# Patient Record
Sex: Male | Born: 1990 | Race: White | Hispanic: No | Marital: Single | Smoking: Never smoker
Health system: Southern US, Community
[De-identification: ages and names within clinical notes are randomized; demographics above are authoritative.]

## PROBLEM LIST (undated history)

## (undated) DIAGNOSIS — D649 Anemia, unspecified: Secondary | ICD-10-CM

## (undated) DIAGNOSIS — R51 Headache: Secondary | ICD-10-CM

## (undated) DIAGNOSIS — I10 Essential (primary) hypertension: Secondary | ICD-10-CM

## (undated) DIAGNOSIS — Q639 Congenital malformation of kidney, unspecified: Secondary | ICD-10-CM

## (undated) DIAGNOSIS — R519 Headache, unspecified: Secondary | ICD-10-CM

## (undated) HISTORY — PX: EYE SURGERY: SHX253

## (undated) HISTORY — PX: KIDNEY TRANSPLANT: SHX239

---

## 2015-01-22 ENCOUNTER — Encounter (HOSPITAL_COMMUNITY): Payer: Self-pay | Admitting: Emergency Medicine

## 2015-01-22 ENCOUNTER — Emergency Department (INDEPENDENT_AMBULATORY_CARE_PROVIDER_SITE_OTHER)
Admission: EM | Admit: 2015-01-22 | Discharge: 2015-01-22 | Disposition: A | Discharge status: Nursing Home (Includes ICF) | Payer: 59 | Source: Home / Self Care | Attending: Emergency Medicine | Admitting: Emergency Medicine

## 2015-01-22 ENCOUNTER — Emergency Department (HOSPITAL_COMMUNITY): Payer: 59

## 2015-01-22 ENCOUNTER — Inpatient Hospital Stay (HOSPITAL_COMMUNITY)
Admission: EM | Admit: 2015-01-22 | Discharge: 2015-01-27 | DRG: 872 | Disposition: A | Discharge status: Home Health | Payer: 59 | Attending: Internal Medicine | Admitting: Internal Medicine

## 2015-01-22 DIAGNOSIS — Z7982 Long term (current) use of aspirin: Secondary | ICD-10-CM

## 2015-01-22 DIAGNOSIS — Z792 Long term (current) use of antibiotics: Secondary | ICD-10-CM

## 2015-01-22 DIAGNOSIS — A4101 Sepsis due to Methicillin susceptible Staphylococcus aureus: Principal | ICD-10-CM | POA: Diagnosis present

## 2015-01-22 DIAGNOSIS — N10 Acute tubulo-interstitial nephritis: Secondary | ICD-10-CM | POA: Diagnosis present

## 2015-01-22 DIAGNOSIS — I10 Essential (primary) hypertension: Secondary | ICD-10-CM | POA: Diagnosis present

## 2015-01-22 DIAGNOSIS — Z886 Allergy status to analgesic agent status: Secondary | ICD-10-CM

## 2015-01-22 DIAGNOSIS — A419 Sepsis, unspecified organism: Secondary | ICD-10-CM | POA: Diagnosis present

## 2015-01-22 DIAGNOSIS — Z87891 Personal history of nicotine dependence: Secondary | ICD-10-CM

## 2015-01-22 DIAGNOSIS — N329 Bladder disorder, unspecified: Secondary | ICD-10-CM | POA: Diagnosis present

## 2015-01-22 DIAGNOSIS — N3289 Other specified disorders of bladder: Secondary | ICD-10-CM

## 2015-01-22 DIAGNOSIS — Z7952 Long term (current) use of systemic steroids: Secondary | ICD-10-CM

## 2015-01-22 DIAGNOSIS — N390 Urinary tract infection, site not specified: Secondary | ICD-10-CM

## 2015-01-22 DIAGNOSIS — R300 Dysuria: Secondary | ICD-10-CM | POA: Diagnosis not present

## 2015-01-22 DIAGNOSIS — R509 Fever, unspecified: Secondary | ICD-10-CM

## 2015-01-22 DIAGNOSIS — Z940 Kidney transplant status: Secondary | ICD-10-CM

## 2015-01-22 DIAGNOSIS — Z91048 Other nonmedicinal substance allergy status: Secondary | ICD-10-CM

## 2015-01-22 DIAGNOSIS — R7881 Bacteremia: Secondary | ICD-10-CM | POA: Insufficient documentation

## 2015-01-22 DIAGNOSIS — Z79899 Other long term (current) drug therapy: Secondary | ICD-10-CM

## 2015-01-22 DIAGNOSIS — T8613 Kidney transplant infection: Secondary | ICD-10-CM

## 2015-01-22 DIAGNOSIS — Z880 Allergy status to penicillin: Secondary | ICD-10-CM

## 2015-01-22 DIAGNOSIS — B9561 Methicillin susceptible Staphylococcus aureus infection as the cause of diseases classified elsewhere: Secondary | ICD-10-CM | POA: Diagnosis present

## 2015-01-22 HISTORY — DX: Congenital malformation of kidney, unspecified: Q63.9

## 2015-01-22 HISTORY — DX: Anemia, unspecified: D64.9

## 2015-01-22 HISTORY — DX: Essential (primary) hypertension: I10

## 2015-01-22 HISTORY — DX: Headache: R51

## 2015-01-22 HISTORY — DX: Headache, unspecified: R51.9

## 2015-01-22 LAB — COMPREHENSIVE METABOLIC PANEL
ALBUMIN: 4.1 g/dL (ref 3.5–5.0)
ALT: 67 U/L — AB (ref 17–63)
AST: 66 U/L — AB (ref 15–41)
Alkaline Phosphatase: 75 U/L (ref 38–126)
Anion gap: 9 (ref 5–15)
BUN: 10 mg/dL (ref 6–20)
CHLORIDE: 101 mmol/L (ref 101–111)
CO2: 25 mmol/L (ref 22–32)
CREATININE: 0.98 mg/dL (ref 0.61–1.24)
Calcium: 9.7 mg/dL (ref 8.9–10.3)
GFR calc non Af Amer: >60 mL/min (ref >60)
GLUCOSE: 113 mg/dL — AB (ref 65–99)
Potassium: 3.6 mmol/L (ref 3.5–5.1)
SODIUM: 135 mmol/L (ref 135–145)
Total Bilirubin: 1 mg/dL (ref 0.3–1.2)
Total Protein: 7.5 g/dL (ref 6.5–8.1)

## 2015-01-22 LAB — URINALYSIS, ROUTINE W REFLEX MICROSCOPIC
Bilirubin Urine: NEGATIVE
GLUCOSE, UA: NEGATIVE mg/dL
Ketones, ur: 15 mg/dL — AB
Nitrite: NEGATIVE
PROTEIN: NEGATIVE mg/dL
Specific Gravity, Urine: 1.009 (ref 1.005–1.030)
pH: 6.5 (ref 5.0–8.0)

## 2015-01-22 LAB — CBC
HCT: 46.4 % (ref 39.0–52.0)
Hemoglobin: 15.5 g/dL (ref 13.0–17.0)
MCH: 28.8 pg (ref 26.0–34.0)
MCHC: 33.4 g/dL (ref 30.0–36.0)
MCV: 86.1 fL (ref 78.0–100.0)
PLATELETS: 199 10*3/uL (ref 150–400)
RBC: 5.39 MIL/uL (ref 4.22–5.81)
RDW: 13 % (ref 11.5–15.5)
WBC: 17 10*3/uL — ABNORMAL HIGH (ref 4.0–10.5)

## 2015-01-22 LAB — POCT URINALYSIS DIP (DEVICE)
BILIRUBIN URINE: NEGATIVE
GLUCOSE, UA: NEGATIVE mg/dL
KETONES UR: NEGATIVE mg/dL
Nitrite: NEGATIVE
Protein, ur: NEGATIVE mg/dL
Specific Gravity, Urine: 1.01 (ref 1.005–1.030)
Urobilinogen, UA: 0.2 mg/dL (ref 0.0–1.0)
pH: 7 (ref 5.0–8.0)

## 2015-01-22 LAB — I-STAT CG4 LACTIC ACID, ED
Lactic Acid, Venous: 1.06 mmol/L (ref 0.5–2.0)
Lactic Acid, Venous: 0.81 mmol/L (ref 0.5–2.0)

## 2015-01-22 LAB — I-STAT CHEM 8, ED
BUN: 14 mg/dL (ref 6–20)
CHLORIDE: 98 mmol/L — AB (ref 101–111)
CREATININE: 0.9 mg/dL (ref 0.61–1.24)
Calcium, Ion: 1.21 mmol/L (ref 1.12–1.23)
GLUCOSE: 110 mg/dL — AB (ref 65–99)
HCT: 51 % (ref 39.0–52.0)
Hemoglobin: 17.3 g/dL — ABNORMAL HIGH (ref 13.0–17.0)
POTASSIUM: 3.6 mmol/L (ref 3.5–5.1)
Sodium: 137 mmol/L (ref 135–145)
TCO2: 24 mmol/L (ref 0–100)

## 2015-01-22 LAB — URINE MICROSCOPIC-ADD ON

## 2015-01-22 LAB — LIPASE, BLOOD: LIPASE: 23 U/L (ref 11–51)

## 2015-01-22 LAB — I-STAT TROPONIN, ED: Troponin i, poc: 0.01 ng/mL (ref 0.00–0.08)

## 2015-01-22 MED ORDER — BARIUM SULFATE 2.1 % PO SUSP
ORAL | Status: AC
  Filled 2015-01-22: qty 2

## 2015-01-22 MED ORDER — SODIUM CHLORIDE 0.9 % IV BOLUS (SEPSIS)
1000.0000 mL | Freq: Once | INTRAVENOUS | Status: AC
  Administered 2015-01-22: 1000 mL via INTRAVENOUS

## 2015-01-22 NOTE — ED Notes (Signed)
The [facility] with a complaint of a possible UTI with a fever. The patient stated that he has dysuria. He also stated that he had bilateral kidney transplants in 2006 and he contacted his nephrologist that [facility].

## 2015-01-22 NOTE — ED Notes (Addendum)
Chem-8 and I-stat trop result are not this pts results RN aware

## 2015-01-22 NOTE — ED Notes (Signed)
Phlebotomy at bedside at this time.

## 2015-01-22 NOTE — ED Provider Notes (Signed)
CSN: [id number]     Arrival date & time 01/22/15  2023 History   First MD Initiated Contact with Patient 01/22/15 2107     Chief Complaint  Patient presents with  . Fever  . Flank Pain     (Consider location/radiation/quality/duration/timing/severity/associated sxs/prior Treatment) HPI   Larry Mcgee is a 24 y.o. male with PMH significant for congenital renal anomaly with kidney transplant who presents from UC for further evaluation of dysuria.  Patient reports gradual onset, moderate, dysuria that began approximately 4 PM.  No meds PTA.  No aggravating factors.  Associated symptoms include abdominal pain, fever, nausea, and cough.  Denies vomiting, diarrhea, hematuria, CP, or SOB.  [place]oved [place] in [place] and does not have a PCP.   Past Medical History  Diagnosis Date  . Congenital renal anomaly    Past Surgical History  Procedure Laterality Date  . Kidney transplant     No family history on file. Social History  Substance Use Topics  . Smoking status: Current Some Day Smoker  . Smokeless tobacco: None  . Alcohol Use: Yes    Review of Systems  All other systems negative unless otherwise stated in HPI   Allergies  Ampicillin; Codeine; and Latex  Home Medications   Prior to Admission medications   Medication Sig Start Date End Date Taking? Authorizing Provider  acyclovir (ZOVIRAX) 200 MG capsule Take 200 mg by mouth 2 (two) times daily.   Yes Historical Provider, MD  aspirin 81 MG tablet Take 81 mg by mouth daily.   Yes Historical Provider, MD  lisinopril (PRINIVIL,ZESTRIL) 5 MG tablet Take 5 mg by mouth daily.   Yes Historical Provider, MD  Multiple Vitamin (MULTIVITAMIN WITH MINERALS) TABS tablet Take 1 tablet by mouth daily.   Yes Historical Provider, MD  mycophenolate (CELLCEPT) 500 MG tablet Take 750 mg by mouth 2 (two) times daily.   Yes Historical Provider, MD  predniSONE (DELTASONE) 5 MG tablet Take 5 mg by mouth daily with breakfast.   Yes  Historical Provider, MD  tacrolimus (PROGRAF) 1 MG capsule Take 4 mg by mouth 2 (two) times daily.   Yes Historical Provider, MD  nitrofurantoin (MACRODANTIN) 50 MG capsule Take 50 mg by mouth at bedtime.    Historical Provider, MD   BP 124/65 mmHg  Pulse 104  Temp(Src) 100.6 F (38.1 C) (Oral)  Resp 16  SpO2 100% Physical Exam  Constitutional: He is oriented to person, place, and time. He appears well-developed and well-nourished.  HENT:  Head: Normocephalic and atraumatic.  Mouth/Throat: Oropharynx is clear and moist.  Eyes: Conjunctivae are normal. Pupils are equal, round, and reactive to light.  Neck: Normal range of motion. Neck supple.  Cardiovascular: Regular rhythm and normal heart sounds.  Tachycardia present.   No murmur heard. Pulmonary/Chest: Effort normal and breath sounds normal. No accessory muscle usage or stridor. No respiratory distress. He has no wheezes. He has no rhonchi. He has no rales.  Abdominal: Soft. Bowel sounds are normal. He exhibits no distension. There is tenderness in the right upper quadrant, right lower quadrant, periumbilical area and suprapubic area. There is no rebound and no guarding.  Musculoskeletal: Normal range of motion.  Lymphadenopathy:    He has no cervical adenopathy.  Neurological: He is alert and oriented to person, place, and time.  Speech clear without dysarthria.  Skin: Skin is warm and dry.  Psychiatric: He has a normal mood and affect. His behavior is normal.    ED Course  Procedures (including critical care time) Labs Review Labs Reviewed  COMPREHENSIVE METABOLIC PANEL - Abnormal; Notable for the following:    Glucose, Bld 113 (*)    AST 66 (*)    ALT 67 (*)    All other components within normal limits  CBC - Abnormal; Notable for the following:    WBC 17.0 (*)    All other components within normal limits  URINALYSIS, ROUTINE W REFLEX MICROSCOPIC (NOT AT [facility]) - Abnormal; Notable for the following:    Hgb urine  dipstick SMALL (*)    Ketones, ur 15 (*)    Leukocytes, UA MODERATE (*)    All other components within normal limits  URINE MICROSCOPIC-ADD ON - Abnormal; Notable for the following:    Squamous Epithelial / LPF 0-5 (*)    Bacteria, UA FEW (*)    All other components within normal limits  I-STAT CHEM 8, ED - Abnormal; Notable for the following:    Chloride 98 (*)    Glucose, Bld 110 (*)    Hemoglobin 17.3 (*)    All other components within normal limits  CULTURE, BLOOD (ROUTINE X 2)  CULTURE, BLOOD (ROUTINE X 2)  LIPASE, BLOOD  I-STAT CG4 LACTIC ACID, ED  I-STAT TROPOININ, ED  I-STAT CG4 LACTIC ACID, ED    Imaging Review Dg Chest Port 1 View  01/22/2015  CLINICAL DATA:  Right lower quadrant pain, cough, fever. Renal transplant. EXAM: PORTABLE CHEST 1 VIEW COMPARISON:  None. FINDINGS: A single AP portable view of the chest demonstrates no focal airspace consolidation or alveolar edema. The lungs are grossly clear. There is no large effusion or pneumothorax. Cardiac and mediastinal contours appear unremarkable. IMPRESSION: No active disease. Electronically Signed   By: Ellery Plunk M.D.   On: 01/22/2015 21:40   I have personally reviewed and evaluated these images and lab results as part of my medical decision-making.   EKG Interpretation None      MDM   Final diagnoses:  UTI (lower urinary tract infection)    Patient presents with lower abdominal pain and dysuria that started this afternoon.  Seen at [facility] and sent here for further evaluation.  Patient is febrile 100.6, tachycardic at 107, normal respirations, no hypoxia, and normotensive.  On exam, abdomen is soft and tender in RLQ, RUQ, periumbilical area, and suprapubic area.  Will obtain labs, start fluids, and will obtain CT abdomen pelvis.  Patient refused CT abdomen and pelvis.  Will likely admit given transplant history and immunocompromised state for sepsis r/o.  -Lactic acid 1.06 -CBC shows leukocytosis of 17.0,  hgb 15.5 -CMP unremarkable.  Cr 0.98. -UA shows small hgb and moderate leukocytes.  Will give IV levaquin and aztreonam   Patient admitted to hospitalist, Dr. Toniann Fail.  Case has been discussed with and seen by Dr. Denton Lank who agrees with the above plan for admission.   Cheri Fowler, PA-C 01/23/15 0007  Cathren Laine, MD 01/24/15 [phone]

## 2015-01-22 NOTE — ED Notes (Addendum)
Pt states hes having pain on his RLQ, fever, cough. Pt states hes had hx of kidney transplants and its hurting him there. Pt is AAOx4, in NAD. Pt has extensive abdominal surgeries, self caths 5 times a day, hx of bilateral kidney transplants

## 2015-01-22 NOTE — ED Provider Notes (Signed)
CSN: [id number]     Arrival date & time 01/22/15  1920 History   First MD Initiated Contact with Patient 01/22/15 1952     Chief Complaint  Patient presents with  . Fever  . Dysuria  . Urinary Tract Infection   (Consider location/radiation/quality/duration/timing/severity/associated sxs/prior Treatment) HPI  He is a 24 year old man here for evaluation of fever and kidney pain. He states he was born with abnormal kidneys and received a kidney transplant in 2006. Today around 4:30 he developed slight dysuria, pain in his kidney, and fever. He reports some nausea, but no vomiting. He has [place] [facility] [place] [place]. He called his nephrologist who sent him to the urgent care.  Past Medical History  Diagnosis Date  . Congenital renal anomaly    Past Surgical History  Procedure Laterality Date  . Kidney transplant     No family history on file. Social History  Substance Use Topics  . Smoking status: Not on file  . Smokeless tobacco: Not on file  . Alcohol Use: Not on file    Review of Systems As in history of present illness Allergies  Ampicillin and Codeine  Home Medications   Prior to Admission medications   Medication Sig Start Date End Date Taking? Authorizing Provider  acyclovir (ZOVIRAX) 200 MG capsule Take 200 mg by mouth 2 (two) times daily.   Yes Historical Provider, MD  aspirin 81 MG tablet Take 81 mg by mouth daily.   Yes Historical Provider, MD  lisinopril (PRINIVIL,ZESTRIL) 5 MG tablet Take 5 mg by mouth daily.   Yes Historical Provider, MD  mycophenolate (CELLCEPT) 500 MG tablet Take 750 mg by mouth 2 (two) times daily.   Yes Historical Provider, MD  prednisoLONE 5 MG TABS tablet Take by mouth.   Yes Historical Provider, MD  tacrolimus (PROGRAF) 1 MG capsule Take 4 mg by mouth 2 (two) times daily.   Yes Historical Provider, MD   Meds Ordered and Administered this Visit  Medications - No data to display  BP 127/80 mmHg  Pulse 121  Temp(Src) 102.4 F  (39.1 C) (Oral)  Resp 18  SpO2 97% No data found.   Physical Exam  Constitutional: He appears well-developed and well-nourished. No distress.  Cardiovascular: Regular rhythm.   Tachycardic  Pulmonary/Chest: Effort normal.  Abdominal: Soft.  He has a well-healed scar in the right lower quadrant. This is where his kidney is located. He is tender in this area. He also has a suprapubic ostomy.    ED Course  Procedures (including critical care time)  Labs Review Labs Reviewed  POCT URINALYSIS DIP (DEVICE) - Abnormal; Notable for the following:    Hgb urine dipstick SMALL (*)    Leukocytes, UA SMALL (*)    All other components within normal limits  URINE CULTURE    Imaging Review No results found.    MDM   1. Fever, unspecified fever cause   2. History of renal transplant   3. Dysuria    UA is concerning for infection with hemoglobin and white cells. More concerning is the pain in his kidney. As I am unable to do the appropriate blood work or imaging, will transfer to [facility] emergency room for workup and management. He is stable for transfer via shuttle.    Charm RingsErin J Aaleah Hirsch, MD 01/22/15 2026

## 2015-01-23 ENCOUNTER — Encounter (HOSPITAL_COMMUNITY): Payer: Self-pay | Admitting: *Deleted

## 2015-01-23 DIAGNOSIS — I10 Essential (primary) hypertension: Secondary | ICD-10-CM | POA: Diagnosis present

## 2015-01-23 DIAGNOSIS — N10 Acute tubulo-interstitial nephritis: Secondary | ICD-10-CM | POA: Diagnosis present

## 2015-01-23 DIAGNOSIS — Z87891 Personal history of nicotine dependence: Secondary | ICD-10-CM | POA: Diagnosis not present

## 2015-01-23 DIAGNOSIS — B9561 Methicillin susceptible Staphylococcus aureus infection as the cause of diseases classified elsewhere: Secondary | ICD-10-CM | POA: Diagnosis present

## 2015-01-23 DIAGNOSIS — N390 Urinary tract infection, site not specified: Secondary | ICD-10-CM | POA: Diagnosis not present

## 2015-01-23 DIAGNOSIS — A419 Sepsis, unspecified organism: Secondary | ICD-10-CM

## 2015-01-23 DIAGNOSIS — A4101 Sepsis due to Methicillin susceptible Staphylococcus aureus: Secondary | ICD-10-CM | POA: Diagnosis present

## 2015-01-23 DIAGNOSIS — Z886 Allergy status to analgesic agent status: Secondary | ICD-10-CM | POA: Diagnosis not present

## 2015-01-23 DIAGNOSIS — Z880 Allergy status to penicillin: Secondary | ICD-10-CM | POA: Diagnosis not present

## 2015-01-23 DIAGNOSIS — Z940 Kidney transplant status: Secondary | ICD-10-CM

## 2015-01-23 DIAGNOSIS — N329 Bladder disorder, unspecified: Secondary | ICD-10-CM | POA: Diagnosis present

## 2015-01-23 DIAGNOSIS — Z91048 Other nonmedicinal substance allergy status: Secondary | ICD-10-CM | POA: Diagnosis not present

## 2015-01-23 DIAGNOSIS — Z792 Long term (current) use of antibiotics: Secondary | ICD-10-CM | POA: Diagnosis not present

## 2015-01-23 DIAGNOSIS — Z7982 Long term (current) use of aspirin: Secondary | ICD-10-CM | POA: Diagnosis not present

## 2015-01-23 DIAGNOSIS — R509 Fever, unspecified: Secondary | ICD-10-CM | POA: Diagnosis present

## 2015-01-23 DIAGNOSIS — Z7952 Long term (current) use of systemic steroids: Secondary | ICD-10-CM | POA: Diagnosis not present

## 2015-01-23 DIAGNOSIS — R7881 Bacteremia: Secondary | ICD-10-CM | POA: Diagnosis not present

## 2015-01-23 DIAGNOSIS — Z79899 Other long term (current) drug therapy: Secondary | ICD-10-CM | POA: Diagnosis not present

## 2015-01-23 LAB — PROCALCITONIN: Procalcitonin: <0.1 ng/mL

## 2015-01-23 LAB — COMPREHENSIVE METABOLIC PANEL
ALK PHOS: 58 U/L (ref 38–126)
ALT: 47 U/L (ref 17–63)
AST: 45 U/L — AB (ref 15–41)
Albumin: 3.1 g/dL — ABNORMAL LOW (ref 3.5–5.0)
Anion gap: 6 (ref 5–15)
BUN: 9 mg/dL (ref 6–20)
CHLORIDE: 105 mmol/L (ref 101–111)
CO2: 26 mmol/L (ref 22–32)
CREATININE: 1.13 mg/dL (ref 0.61–1.24)
Calcium: 8.5 mg/dL — ABNORMAL LOW (ref 8.9–10.3)
GFR calc Af Amer: >60 mL/min (ref >60)
Glucose, Bld: 124 mg/dL — ABNORMAL HIGH (ref 65–99)
Potassium: 3.5 mmol/L (ref 3.5–5.1)
Sodium: 137 mmol/L (ref 135–145)
Total Bilirubin: 1 mg/dL (ref 0.3–1.2)
Total Protein: 5.9 g/dL — ABNORMAL LOW (ref 6.5–8.1)

## 2015-01-23 LAB — CBC WITH DIFFERENTIAL/PLATELET
Basophils Absolute: 0 10*3/uL (ref 0.0–0.1)
Basophils Relative: 0 %
Eosinophils Absolute: 0 10*3/uL (ref 0.0–0.7)
Eosinophils Relative: 0 %
HCT: 41.8 % (ref 39.0–52.0)
HEMOGLOBIN: 14.1 g/dL (ref 13.0–17.0)
LYMPHS ABS: 1.2 10*3/uL (ref 0.7–4.0)
LYMPHS PCT: 8 %
MCH: 29.3 pg (ref 26.0–34.0)
MCHC: 33.7 g/dL (ref 30.0–36.0)
MCV: 86.9 fL (ref 78.0–100.0)
Monocytes Absolute: 2.4 10*3/uL — ABNORMAL HIGH (ref 0.1–1.0)
Monocytes Relative: 16 %
NEUTROS PCT: 76 %
Neutro Abs: 11.3 10*3/uL — ABNORMAL HIGH (ref 1.7–7.7)
Platelets: 179 10*3/uL (ref 150–400)
RBC: 4.81 MIL/uL (ref 4.22–5.81)
RDW: 13 % (ref 11.5–15.5)
WBC: 14.9 10*3/uL — AB (ref 4.0–10.5)

## 2015-01-23 LAB — LACTIC ACID, PLASMA: LACTIC ACID, VENOUS: 0.8 mmol/L (ref 0.5–2.0)

## 2015-01-23 MED ORDER — ACYCLOVIR 200 MG PO CAPS
200.0000 mg | ORAL_CAPSULE | Freq: Two times a day (BID) | ORAL | Status: DC
  Administered 2015-01-23 – 2015-01-27 (×9): 200 mg via ORAL
  Filled 2015-01-23 (×14): qty 1

## 2015-01-23 MED ORDER — ONDANSETRON HCL 4 MG/2ML IJ SOLN: 4.0000 mg | Freq: Four times a day (QID) | INTRAMUSCULAR | Status: DC | PRN

## 2015-01-23 MED ORDER — ACETAMINOPHEN 650 MG RE SUPP: 650.0000 mg | Freq: Four times a day (QID) | RECTAL | Status: DC | PRN

## 2015-01-23 MED ORDER — SODIUM CHLORIDE 0.9 % IV SOLN
1.0000 g | Freq: Three times a day (TID) | INTRAVENOUS | Status: DC
  Administered 2015-01-23 – 2015-01-24 (×5): 1 g via INTRAVENOUS
  Filled 2015-01-23 (×7): qty 1

## 2015-01-23 MED ORDER — PREDNISONE 5 MG PO TABS
5.0000 mg | ORAL_TABLET | Freq: Every day | ORAL | Status: DC
  Administered 2015-01-23 – 2015-01-27 (×5): 5 mg via ORAL
  Filled 2015-01-23 (×5): qty 1

## 2015-01-23 MED ORDER — LISINOPRIL 5 MG PO TABS
5.0000 mg | ORAL_TABLET | Freq: Every day | ORAL | Status: DC
  Administered 2015-01-23 – 2015-01-24 (×2): 5 mg via ORAL
  Filled 2015-01-23 (×3): qty 1

## 2015-01-23 MED ORDER — VANCOMYCIN HCL IN DEXTROSE 1-5 GM/200ML-% IV SOLN
1000.0000 mg | Freq: Three times a day (TID) | INTRAVENOUS | Status: DC
  Administered 2015-01-23 – 2015-01-24 (×3): 1000 mg via INTRAVENOUS
  Filled 2015-01-23 (×5): qty 200

## 2015-01-23 MED ORDER — SODIUM CHLORIDE 0.9 % IV SOLN
INTRAVENOUS | Status: AC
  Administered 2015-01-23 (×2): via INTRAVENOUS

## 2015-01-23 MED ORDER — SODIUM CHLORIDE 0.9 % IV BOLUS (SEPSIS)
1000.0000 mL | Freq: Once | INTRAVENOUS | Status: AC
  Administered 2015-01-23: 1000 mL via INTRAVENOUS

## 2015-01-23 MED ORDER — MYCOPHENOLATE MOFETIL 250 MG PO CAPS
750.0000 mg | ORAL_CAPSULE | Freq: Two times a day (BID) | ORAL | Status: DC
  Administered 2015-01-23 – 2015-01-27 (×10): 750 mg via ORAL
  Filled 2015-01-23 (×10): qty 3

## 2015-01-23 MED ORDER — ACETAMINOPHEN 325 MG PO TABS
650.0000 mg | ORAL_TABLET | Freq: Once | ORAL | Status: AC
  Administered 2015-01-23: 650 mg via ORAL
  Filled 2015-01-23: qty 2

## 2015-01-23 MED ORDER — TACROLIMUS 1 MG PO CAPS
4.0000 mg | ORAL_CAPSULE | Freq: Two times a day (BID) | ORAL | Status: DC
  Administered 2015-01-23 – 2015-01-27 (×10): 4 mg via ORAL
  Filled 2015-01-23 (×10): qty 4

## 2015-01-23 MED ORDER — LEVOFLOXACIN IN D5W 500 MG/100ML IV SOLN
500.0000 mg | Freq: Every day | INTRAVENOUS | Status: DC
Start: 2015-01-23 — End: 2015-01-23
  Administered 2015-01-23: 500 mg via INTRAVENOUS
  Filled 2015-01-23: qty 100

## 2015-01-23 MED ORDER — ACETAMINOPHEN 325 MG PO TABS
650.0000 mg | ORAL_TABLET | Freq: Four times a day (QID) | ORAL | Status: DC | PRN
  Administered 2015-01-23 (×3): 650 mg via ORAL
  Filled 2015-01-23 (×3): qty 2

## 2015-01-23 MED ORDER — ONDANSETRON HCL 4 MG PO TABS: 4.0000 mg | ORAL_TABLET | Freq: Four times a day (QID) | ORAL | Status: DC | PRN

## 2015-01-23 MED ORDER — ENOXAPARIN SODIUM 40 MG/0.4ML ~~LOC~~ SOLN
40.0000 mg | SUBCUTANEOUS | Status: DC
  Filled 2015-01-23 (×2): qty 0.4

## 2015-01-23 MED ORDER — ASPIRIN 81 MG PO CHEW
81.0000 mg | CHEWABLE_TABLET | Freq: Every day | ORAL | Status: DC
  Administered 2015-01-23 – 2015-01-27 (×5): 81 mg via ORAL
  Filled 2015-01-23 (×5): qty 1

## 2015-01-23 NOTE — H&P (Signed)
[facility] History and Physical  [name] ZOX:[id number] DOB: [birth year] DOA: 01/22/2015  Referring physician: Ms.Rose. PCP: No primary care provider on file. patient just moved from [place]. Specialists: Patient is planning to follow-up with Dr. Sherron Monday. Urologist.  Chief Complaint: Right flank pain and dysuria and fever chills.  HPI: Larry Mcgee is a 24 y.o. male with history of renal transplant in 2006 on immunosuppressants who has had recurrent episodes of UTI and has just moved from [place] to [place] presents to the ER because of right flank pain and fever chills and dysuria. Patient also does self-catheterization. In the ER patient was found to be tachycardic and febrile with UA showing WBC and leukocyte esterase positive. Patient's flank pain is mostly right side via patient has his renal transplant. Denies any nausea vomiting or diarrhea. Denies any chest pain or shortness of breath or productive cough. In the ER patient was given 2 L normal saline bolus and started on empiric antibiotics after blood cultures and urine cultures were obtained. Patient will be admitted for sepsis from pyelonephritis. Patient is making urine.   Review of Systems: As presented in the history of presenting illness, rest negative.  Past Medical History  Diagnosis Date  . Congenital renal anomaly   . Hypertension   . Headache   . Anemia    Past Surgical History  Procedure Laterality Date  . Kidney transplant    . Eye surgery     Social History:  reports that he has never smoked. He does not have any smokeless tobacco history on file. He reports that he drinks about 9.0 oz of alcohol per week. He reports that he uses illicit drugs (Marijuana). Where does patient live home. Can patient participate in ADLs? Yes.  Allergies  Allergen Reactions  . Ampicillin Itching and Rash  . Codeine Itching and Rash  . Latex Itching and Rash    Family History:  Family History  Problem  Relation Age of Onset  . Diabetes Mellitus II Neg Hx   . Hypertension Neg Hx       Prior to Admission medications   Medication Sig Start Date End Date Taking? Authorizing Provider  acyclovir (ZOVIRAX) 200 MG capsule Take 200 mg by mouth 2 (two) times daily.   Yes Historical Provider, MD  aspirin 81 MG tablet Take 81 mg by mouth daily.   Yes Historical Provider, MD  lisinopril (PRINIVIL,ZESTRIL) 5 MG tablet Take 5 mg by mouth daily.   Yes Historical Provider, MD  Multiple Vitamin (MULTIVITAMIN WITH MINERALS) TABS tablet Take 1 tablet by mouth daily.   Yes Historical Provider, MD  mycophenolate (CELLCEPT) 500 MG tablet Take 750 mg by mouth 2 (two) times daily.   Yes Historical Provider, MD  predniSONE (DELTASONE) 5 MG tablet Take 5 mg by mouth daily with breakfast.   Yes Historical Provider, MD  tacrolimus (PROGRAF) 1 MG capsule Take 4 mg by mouth 2 (two) times daily.   Yes Historical Provider, MD  nitrofurantoin (MACRODANTIN) 50 MG capsule Take 50 mg by mouth at bedtime.    Historical Provider, MD    Physical Exam: Filed Vitals:   01/23/15 0030 01/23/15 0045 01/23/15 0045 01/23/15 0151  BP: 133/74 140/68 140/68 124/59  Pulse: 114 114 113 118  Temp:   102.1 F (38.9 C) 100.6 F (38.1 C)  TempSrc:   Oral Oral  Resp:   16 17  Weight:    87.5 kg (192 lb 14.4 oz)  SpO2: 97% 96% 96% 97%  General:  Moderately built and nourished.  Eyes: Anicteric no pallor.  ENT: No discharge from the ears eyes nose and mouth.  Neck: No mass felt.  Cardiovascular: S1-S2 heard.  Respiratory: No rhonchi or crepitations.  Abdomen: Right flank tenderness where patient has his renal transplant kidney. No guarding or rigidity.  Skin: No rash.  Musculoskeletal: No edema.  Psychiatric: Appears normal.  Neurologic: Alert awake oriented to time place and person. Moves all extremities.  Labs on Admission:  Basic Metabolic Panel:  Recent Labs Lab 01/22/15 2056 01/22/15 2106  NA 137 135  K  3.6 3.6  CL 98* 101  CO2  --  25  GLUCOSE 110* 113*  BUN 14 10  CREATININE 0.90 0.98  CALCIUM  --  9.7   Liver Function Tests:  Recent Labs Lab 01/22/15 2106  AST 66*  ALT 67*  ALKPHOS 75  BILITOT 1.0  PROT 7.5  ALBUMIN 4.1    Recent Labs Lab 01/22/15 2106  LIPASE 23   No results for input(s): AMMONIA in the last 168 hours. CBC:  Recent Labs Lab 01/22/15 2056 01/22/15 2106  WBC  --  17.0*  HGB 17.3* 15.5  HCT 51.0 46.4  MCV  --  86.1  PLT  --  199   Cardiac Enzymes: No results for input(s): CKTOTAL, CKMB, CKMBINDEX, TROPONINI in the last 168 hours.  BNP (last 3 results) No results for input(s): BNP in the last 8760 hours.  ProBNP (last 3 results) No results for input(s): PROBNP in the last 8760 hours.  CBG: No results for input(s): GLUCAP in the last 168 hours.  Radiological Exams on Admission: Dg Chest Port 1 View  01/22/2015  CLINICAL DATA:  Right lower quadrant pain, cough, fever. Renal transplant. EXAM: PORTABLE CHEST 1 VIEW COMPARISON:  None. FINDINGS: A single AP portable view of the chest demonstrates no focal airspace consolidation or alveolar edema. The lungs are grossly clear. There is no large effusion or pneumothorax. Cardiac and mediastinal contours appear unremarkable. IMPRESSION: No active disease. Electronically Signed   By: Ellery Plunkaniel R Mitchell M.D.   On: 01/22/2015 21:40     Assessment/Plan Principal Problem:   Sepsis (HCC) Active Problems:   UTI (lower urinary tract infection)   Acute pyelonephritis   Essential hypertension   History of renal transplant   1. Sepsis from pyelonephritis in a transplanted kidney - at this time I have discussed with pharmacy in place patient on meropenem for complicated UTI with sepsis. If patient's right flank pain persist and if patient is still febrile will need scan to rule out obstruction. Patient is making urine. Continue with aggressive hydration and closely follow lactic acid and procalcitonin  levels. Patient is originally planned to follow-up with Dr. Sherron MondayMacDiarmid urologist. Patient is on prednisone but presently her blood pressure is stable in the 130s. Stress dose steroids will be considered if it has any worsening vital signs. 2. Renal transplant - on immunosuppressants which will be continued. 3. History of hypertension on lisinopril. 4. History of acne.  I have reviewed patient's old charts through care everywhere.   DVT Prophylaxis Lovenox.  Code Status: Full code.  Family Communication: Discussed with patient.  Disposition Plan: Admit to inpatient.    Abbee Cremeens N. [facility] Pager [phone].  If 7PM-7AM, please contact night-coverage [email or web address] Password [email or web address] 01/23/2015, 2:51 AM

## 2015-01-23 NOTE — Progress Notes (Signed)
01/23/2015 5:20 PM  Temperature orally 102.4 at 1630.  Pt received 650mg  of tylenol per prn order for headache around 1612.  Pt also tachycardic at 120 per telemetry.  Pt denies chest pain, SOB, or discomfort.  Dr. Jerral RalphGhimire paged, awaiting orders.  Will continue to monitor. Theadora RamaKIRKMAN, Xylon Croom Brooke

## 2015-01-23 NOTE — Progress Notes (Signed)
CRITICAL VALUE ALERT  Critical value received:  1/2 Positive blood cultures for gram positive cocci in pairs  Date of notification:  01/23/15  Time of notification:  1533  Critical value read back:Yes.    Nurse who received alert:  Theadora RamaKIRKMAN, Teion Ballin Brooke  MD notified (1st page):  Dr. Jerral RalphGhimire  Time of first page:  1700  MD notified (2nd page):  Time of second page:  Responding MD:  Dr. Jerral RalphGhimire  Time MD responded:  [phone]

## 2015-01-23 NOTE — Progress Notes (Signed)
PATIENT DETAILS Name: Larry Mcgee Age: 24 y.o. Sex: male Date of Birth: [birth year] Admit Date: 01/22/2015 Admitting Physician Eduard ClosArshad N Kakrakandy, MD PCP:No primary care provider on file.  Subjective: Continues to have right-sided flank pain-but better than on admission-nontoxic appearing.  Assessment/Plan: Principal Problem: Sepsis: Secondary to pyelonephritis. Sepsis pathophysiology improving-with improving fever curve and decreasing leukocytosis, continue IV fluids and IV antibiotics. Follow cultures.  Active Problems: Pyelonephritis in transplanted kidney: Improving, continue meropenem, await urine/blood cultures.   History of renal transplant: Continue prednisone, CellCept and tacrolimus.  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 Sherron MondayMacDiarmid).  Disposition: Remain inpatient-home in 1-2 days  Antimicrobial agents  See below  Anti-infectives    Start     Dose/Rate Route Frequency Ordered Stop   01/23/15 1000  acyclovir (ZOVIRAX) 200 MG capsule 200 mg     200 mg Oral 2 times daily 01/23/15 0251     01/23/15 0400  meropenem (MERREM) 1 g in sodium chloride 0.9 % 100 mL IVPB    Comments:  Meropenem 1 g IV q8h for CrCl > 50 mL/min   1 g 200 mL/hr over 30 Minutes Intravenous 3 times per day 01/23/15 0257     01/23/15 0100  levofloxacin (LEVAQUIN) IVPB 500 mg  Status:  Discontinued     500 mg 100 mL/hr over 60 Minutes Intravenous Daily at bedtime 01/23/15 0021 01/23/15 0251      DVT Prophylaxis: Prophylactic Lovenox   Code Status: Full code   Family Communication None at bedside  Procedures: None  CONSULTS:  None  Time spent 30 minutes-Greater  than 50% of this time was spent in counseling, explanation of diagnosis, planning of further management, and coordination of care  MEDICATIONS: Scheduled Meds: . acyclovir  200 mg Oral BID  . aspirin  81 mg Oral Daily  . enoxaparin (LOVENOX) injection  40 mg Subcutaneous Q24H  . lisinopril  5 mg Oral Daily  . meropenem (MERREM) IV  1 g Intravenous 3 times per day  . mycophenolate  750 mg Oral BID  . predniSONE  5 mg Oral Q breakfast  . tacrolimus  4 mg Oral BID   Continuous Infusions: . sodium chloride 125 mL/hr at 01/23/15 1155   PRN Meds:.acetaminophen **OR** acetaminophen, ondansetron **OR** ondansetron (ZOFRAN) IV    PHYSICAL EXAM: Vital signs in last 24 hours: Filed Vitals:   01/23/15 0045 01/23/15 0151 01/23/15 0400 01/23/15 0910  BP: 140/68 124/59 115/45 114/58  Pulse: 113 118 108 114  Temp: 102.1 F (38.9 C) 100.6 F (38.1 C) 99.4 F (37.4 C) 100 F (37.8 C)  TempSrc: Oral Oral Oral Oral  Resp: 16 17 18 20   Weight:  87.5 kg (192 lb 14.4 oz)    SpO2: 96% 97% 97% 98%    Weight change:  Filed Weights   01/23/15 0151  Weight: 87.5 kg (192 lb 14.4 oz)   There is no height on file to calculate BMI.   Gen Exam: Awake and alert with clear speech.   Neck: Supple, No JVD.   Chest: B/L Clear.   CVS: S1 S2 Regular, no murmurs.  Abdomen: soft, BS +, non tender, non distended. Tender Right flank  Extremities: no edema, lower extremities warm to touch. Neurologic: Non Focal.   Skin: No Rash.   Wounds: N/A.    Intake/Output from previous day:  Intake/Output Summary (Last 24 hours) at 01/23/15 1317 Last data filed at 01/23/15 0900  Gross per 24 hour  Intake 2621.25 ml  Output   1000 ml  Net 1621.25 ml     LAB RESULTS: CBC  Recent Labs Lab 01/22/15 2056 01/22/15 2106 01/23/15 0356  WBC  --  17.0* 14.9*  HGB 17.3* 15.5 14.1  HCT 51.0 46.4 41.8  PLT  --  199 179  MCV  --  86.1 86.9  MCH  --  28.8 29.3  MCHC  --  33.4 33.7  RDW  --  13.0 13.0    LYMPHSABS  --   --  1.2  MONOABS  --   --  2.4*  EOSABS  --   --  0.0  BASOSABS  --   --  0.0    Chemistries   Recent Labs Lab 01/22/15 2056 01/22/15 2106 01/23/15 0356  NA 137 135 137  K 3.6 3.6 3.5  CL 98* 101 105  CO2  --  25 26  GLUCOSE 110* 113* 124*  BUN CREATININE 0.90 0.98 1.13  CALCIUM  --  9.7 8.5*    CBG: No results for input(s): GLUCAP in the last 168 hours.  GFR CrCl cannot be calculated (Unknown ideal weight.).  Coagulation profile No results for input(s): INR, PROTIME in the last 168 hours.  Cardiac Enzymes No results for input(s): CKMB, TROPONINI, MYOGLOBIN in the last 168 hours.  Invalid input(s): CK  Invalid input(s): POCBNP No results for input(s): DDIMER in the last 72 hours. No results for input(s): HGBA1C in the last 72 hours. No results for input(s): CHOL, HDL, LDLCALC, TRIG, CHOLHDL, LDLDIRECT in the last 72 hours. No results for input(s): TSH, T4TOTAL, T3FREE, THYROIDAB in the last 72 hours.  Invalid input(s): FREET3 No results for input(s): VITAMINB12, FOLATE, FERRITIN, TIBC, IRON, RETICCTPCT in the last 72 hours.  Recent Labs  01/22/15 2106  LIPASE 23    Urine Studies No results for input(s): UHGB, CRYS in the last 72 hours.  Invalid input(s): UACOL, UAPR, USPG, UPH, UTP, UGL, UKET, UBIL, UNIT, UROB, ULEU, UEPI, UWBC, URBC, UBAC, CAST, UCOM, BILUA  MICROBIOLOGY: Recent Results (from the past 240 hour(s))  Urine culture     Status: None (Preliminary result)   Collection Time: 01/22/15  8:58 PM  Result Value Ref Range Status   Specimen Description URINE, CLEAN CATCH  Final   Special Requests NONE  Final   Culture TOO YOUNG TO READ  Final   Report Status PENDING  Incomplete    RADIOLOGY STUDIES/RESULTS: Dg Chest Port 1 View  01/22/2015  CLINICAL DATA:  Right lower quadrant pain, cough, fever. Renal transplant. EXAM: PORTABLE CHEST 1 VIEW COMPARISON:  None. FINDINGS: A single AP portable view of the chest  demonstrates no focal airspace consolidation or alveolar edema. The lungs are grossly clear. There is no large effusion or pneumothorax. Cardiac and mediastinal contours appear unremarkable. IMPRESSION: No active disease. Electronically Signed   By: Ellery Plunk M.D.   On: 01/22/2015 21:40    Jeoffrey Massed, MD  [facility] Pager:[phone]  If 7PM-7AM, please contact night-coverage [email or web address] Password [email or web address] 01/23/2015, 1:17 PM   LOS: 0 days

## 2015-01-23 NOTE — Progress Notes (Signed)
Unable to receive report at this time .RN unavailable but will call when ready.

## 2015-01-23 NOTE — Progress Notes (Addendum)
ANTIBIOTIC CONSULT NOTE - INITIAL  Pharmacy Consult for vancomycin Indication: bacteremia  Allergies  Allergen Reactions  . Ampicillin Itching and Rash  . Codeine Itching and Rash  . Latex Itching and Rash    Patient Measurements: Height:  (175.3 cm) Weight: 192 lb 14.4 oz (87.5 kg) IBW/kg (Calculated) : 70.7 Adjusted Body Weight:   Vital Signs: Temp: 102.4 F (39.1 C) (12/01 1631) Temp Source: Oral (12/01 0910) BP: 114/58 mmHg (12/01 0910) Pulse Rate: 114 (12/01 0910) Intake/Output from previous day: 11/30 0701 - 12/01 0700 In: 2381.3 [I.V.:2381.3] Out: 550 [Urine:550] Intake/Output from this shift: Total I/O In: 1610 [P.O.:480; I.V.:1030; IV Piggyback:100] Out: 450 [Urine:450]  Labs:  Recent Labs  01/22/15 2056 01/22/15 2106 01/23/15 0356  WBC  --  17.0* 14.9*  HGB 17.3* 15.5 14.1  PLT  --  199 179  CREATININE 0.90 0.98 1.13   Estimated Creatinine Clearance: 112.3 mL/min (by C-G formula based on Cr of 1.13). No results for input(s): VANCOTROUGH, VANCOPEAK, VANCORANDOM, GENTTROUGH, GENTPEAK, GENTRANDOM, TOBRATROUGH, TOBRAPEAK, TOBRARND, AMIKACINPEAK, AMIKACINTROU, AMIKACIN in the last 72 hours.   Microbiology: Recent Results (from the past 720 hour(s))  Urine culture     Status: None (Preliminary result)   Collection Time: 01/22/15  8:58 PM  Result Value Ref Range Status   Specimen Description URINE, CLEAN CATCH  Final   Special Requests NONE  Final   Culture TOO YOUNG TO READ  Final   Report Status PENDING  Incomplete  Blood culture (routine x 2)     Status: None (Preliminary result)   Collection Time: 01/22/15  9:10 PM  Result Value Ref Range Status   Specimen Description BLOOD LEFT FOREARM  Final   Special Requests BOTTLES DRAWN AEROBIC AND ANAEROBIC 10CC  Final   Culture  Setup Time   Final    GRAM POSITIVE COCCI IN PAIRS AEROBIC BOTTLE ONLY CRITICAL RESULT CALLED TO, READ BACK BY AND VERIFIED WITH: A KIRKMAN,RN AT 1533 01/23/15 BY L  BENFIELD    Culture GRAM POSITIVE COCCI  Final   Report Status PENDING  Incomplete  Blood culture (routine x 2)     Status: None (Preliminary result)   Collection Time: 01/22/15  9:17 PM  Result Value Ref Range Status   Specimen Description BLOOD RIGHT ARM  Final   Special Requests BOTTLES DRAWN AEROBIC AND ANAEROBIC 5CC  Final   Culture NO GROWTH < 24 HOURS  Final   Report Status PENDING  Incomplete    Medical History: Past Medical History  Diagnosis Date  . Congenital renal anomaly   . Hypertension   . Headache   . Anemia     Medications:  Scheduled:  . acyclovir  200 mg Oral BID  . aspirin  81 mg Oral Daily  . enoxaparin (LOVENOX) injection  40 mg Subcutaneous Q24H  . lisinopril  5 mg Oral Daily  . meropenem (MERREM) IV  1 g Intravenous 3 times per day  . mycophenolate  750 mg Oral BID  . predniSONE  5 mg Oral Q breakfast  . tacrolimus  4 mg Oral BID   Infusions:  . sodium chloride 75 mL/hr at 01/23/15 1331   Assessment: 24 yo male with gram positive cocci in pairs in 1/2 blood cx will be added vancomycin per MD's request. CrCl > 100.  Patient has history of renal transplant back in 2006  Goal of Therapy:  Vancomycin trough level 15-20 mcg/ml  Plan:  - vancomycin 1g iv q8h - monitor renal  function closely - check vancomycin trough before 4th or 5th dose - follow up with result of blood culture  Loralye Loberg, Tsz-Yin 01/23/2015,5:24 PM

## 2015-01-24 ENCOUNTER — Inpatient Hospital Stay (HOSPITAL_COMMUNITY): Payer: 59

## 2015-01-24 DIAGNOSIS — N10 Acute tubulo-interstitial nephritis: Secondary | ICD-10-CM

## 2015-01-24 DIAGNOSIS — Z940 Kidney transplant status: Secondary | ICD-10-CM

## 2015-01-24 DIAGNOSIS — I10 Essential (primary) hypertension: Secondary | ICD-10-CM

## 2015-01-24 DIAGNOSIS — R7881 Bacteremia: Secondary | ICD-10-CM

## 2015-01-24 DIAGNOSIS — B9561 Methicillin susceptible Staphylococcus aureus infection as the cause of diseases classified elsewhere: Secondary | ICD-10-CM

## 2015-01-24 DIAGNOSIS — N390 Urinary tract infection, site not specified: Secondary | ICD-10-CM

## 2015-01-24 LAB — BASIC METABOLIC PANEL
ANION GAP: 11 (ref 5–15)
CALCIUM: 9.3 mg/dL (ref 8.9–10.3)
CO2: 26 mmol/L (ref 22–32)
CREATININE: 0.96 mg/dL (ref 0.61–1.24)
Chloride: 100 mmol/L — ABNORMAL LOW (ref 101–111)
GFR calc Af Amer: >60 mL/min (ref >60)
GLUCOSE: 98 mg/dL (ref 65–99)
Potassium: 3.7 mmol/L (ref 3.5–5.1)
Sodium: 137 mmol/L (ref 135–145)

## 2015-01-24 LAB — CBC
HCT: 44 % (ref 39.0–52.0)
Hemoglobin: 14.7 g/dL (ref 13.0–17.0)
MCH: 29.1 pg (ref 26.0–34.0)
MCHC: 33.4 g/dL (ref 30.0–36.0)
MCV: 87 fL (ref 78.0–100.0)
PLATELETS: 156 10*3/uL (ref 150–400)
RBC: 5.06 MIL/uL (ref 4.22–5.81)
RDW: 13.4 % (ref 11.5–15.5)
WBC: 12.6 10*3/uL — ABNORMAL HIGH (ref 4.0–10.5)

## 2015-01-24 LAB — VANCOMYCIN, TROUGH: VANCOMYCIN TR: 7 ug/mL — AB (ref 10.0–20.0)

## 2015-01-24 LAB — POCT I-STAT, CHEM 8
BUN: 14 mg/dL (ref 6–20)
CREATININE: 0.9 mg/dL (ref 0.61–1.24)
Calcium, Ion: 1.21 mmol/L (ref 1.12–1.23)
Chloride: 98 mmol/L — ABNORMAL LOW (ref 101–111)
GLUCOSE: 110 mg/dL — AB (ref 65–99)
HCT: 51 % (ref 39.0–52.0)
HEMOGLOBIN: 17.3 g/dL — AB (ref 13.0–17.0)
Potassium: 3.6 mmol/L (ref 3.5–5.1)
Sodium: 137 mmol/L (ref 135–145)
TCO2: 24 mmol/L (ref 0–100)

## 2015-01-24 LAB — POCT I-STAT TROPONIN I: TROPONIN I, POC: 0.01 ng/mL (ref 0.00–0.08)

## 2015-01-24 MED ORDER — SODIUM CHLORIDE 0.9 % IV SOLN: INTRAVENOUS | Status: DC

## 2015-01-24 MED ORDER — VANCOMYCIN HCL 10 G IV SOLR
1250.0000 mg | Freq: Three times a day (TID) | INTRAVENOUS | Status: DC
  Administered 2015-01-24 – 2015-01-25 (×2): 1250 mg via INTRAVENOUS
  Filled 2015-01-24 (×4): qty 1250

## 2015-01-24 MED ORDER — SODIUM CHLORIDE 0.9 % IV SOLN: INTRAVENOUS | Status: AC

## 2015-01-24 MED ORDER — LISINOPRIL 5 MG PO TABS
5.0000 mg | ORAL_TABLET | Freq: Every day | ORAL | Status: DC
  Administered 2015-01-25 – 2015-01-26 (×2): 5 mg via ORAL
  Filled 2015-01-24 (×2): qty 1

## 2015-01-24 NOTE — Progress Notes (Signed)
ANTIBIOTIC CONSULT NOTE - FOLLOW UP  Pharmacy Consult for vancomycin Indication: bacteremia  Allergies  Allergen Reactions  . Ampicillin Itching and Rash  . Codeine Itching and Rash  . Latex Itching and Rash    Patient Measurements: Height: 5\' 9"  (175.3 cm) Weight: 192 lb 14.4 oz (87.5 kg) IBW/kg (Calculated) : 70.7 Adjusted Body Weight:   Vital Signs: Temp: 97.7 F (36.5 C) (12/02 1700) Temp Source: Oral (12/02 1700) BP: 110/59 mmHg (12/02 1700) Pulse Rate: 74 (12/02 1700) Intake/Output from previous day: 12/01 0701 - 12/02 0700 In: 3561.3 [P.O.:960; I.V.:1901.3; IV Piggyback:700] Out: 450 [Urine:450] Intake/Output from this shift:    Labs:  Recent Labs  01/22/15 2106 01/23/15 0356 01/24/15 0658  WBC 17.0* 14.9* 12.6*  HGB 15.5 14.1 14.7  PLT 199 179 156  CREATININE 0.98 1.13 0.96   Estimated Creatinine Clearance: 132.1 mL/min (by C-G formula based on Cr of 0.96).  Recent Labs  01/24/15 1831  VANCOTROUGH 7*     Microbiology: Recent Results (from the past 720 hour(s))  Urine culture     Status: None (Preliminary result)   Collection Time: 01/22/15  8:58 PM  Result Value Ref Range Status   Specimen Description URINE, CLEAN CATCH  Final   Special Requests NONE  Final   Culture >=100,000 COLONIES/mL STAPHYLOCOCCUS AUREUS  Final   Report Status PENDING  Incomplete  Blood culture (routine x 2)     Status: None (Preliminary result)   Collection Time: 01/22/15  9:10 PM  Result Value Ref Range Status   Specimen Description BLOOD LEFT FOREARM  Final   Special Requests BOTTLES DRAWN AEROBIC AND ANAEROBIC 10CC  Final   Culture  Setup Time   Final    GRAM POSITIVE COCCI IN PAIRS IN BOTH AEROBIC AND ANAEROBIC BOTTLES CRITICAL RESULT CALLED TO, READ BACK BY AND VERIFIED WITH: A [facility] LLCKIRKMAN,RN AT 1533 01/23/15 BY L BENFIELD    Culture STAPHYLOCOCCUS AUREUS  Final   Report Status PENDING  Incomplete  Blood culture (routine x 2)     Status: None (Preliminary result)    Collection Time: 01/22/15  9:17 PM  Result Value Ref Range Status   Specimen Description BLOOD RIGHT ARM  Final   Special Requests BOTTLES DRAWN AEROBIC AND ANAEROBIC 5CC  Final   Culture NO GROWTH 2 DAYS  Final   Report Status PENDING  Incomplete    Anti-infectives    Start     Dose/Rate Route Frequency Ordered Stop   01/24/15 2000  vancomycin (VANCOCIN) 1,250 mg in sodium chloride 0.9 % 250 mL IVPB     1,250 mg 166.7 mL/hr over 90 Minutes Intravenous Every 8 hours 01/24/15 1914     01/23/15 1900  vancomycin (VANCOCIN) IVPB 1000 mg/200 mL premix  Status:  Discontinued     1,000 mg 200 mL/hr over 60 Minutes Intravenous Every 8 hours 01/23/15 1730 01/24/15 1914   01/23/15 1000  acyclovir (ZOVIRAX) 200 MG capsule 200 mg     200 mg Oral 2 times daily 01/23/15 0251     01/23/15 0400  meropenem (MERREM) 1 g in sodium chloride 0.9 % 100 mL IVPB  Status:  Discontinued    Comments:  Meropenem 1 g IV q8h for CrCl > 50 mL/min   1 g 200 mL/hr over 30 Minutes Intravenous 3 times per day 01/23/15 0257 01/24/15 1417   01/23/15 0100  levofloxacin (LEVAQUIN) IVPB 500 mg  Status:  Discontinued     500 mg 100 mL/hr over 60 Minutes Intravenous  Daily at bedtime 01/23/15 0021 01/23/15 0251      Assessment: 24 yo male with staph aureus bacteremia in 1/2 blood cx is currently on subtherapeutic vancomycin.  Vancomycin trough was 7.  CrCl > 100.  Patient has history of renal transplant  Goal of Therapy:  Vancomycin trough level 15-20 mcg/ml  Plan:  - increase vancomycin to 1250 mg iv q8h - vancomycin trough at steady state  Srija Southard, Tsz-Yin 01/24/2015,7:15 PM

## 2015-01-24 NOTE — Consult Note (Signed)
[facility]  Date of Admission:  01/22/2015  Date of Consult:  01/24/2015  Reason for Consult:Staph bacteremia Referring Physician: Cipriano Bunker  Impression/Recommendation Staph aureus bacteremia Continue vanco for now Check TTE Repeat BCx He denies presence of foreign bodies.   Renal Txp Check renal u/s Continue immunosuppressants  Check HIV  Thank you so much for this interesting consult,   Larry Mcgee (pager) [phone] [email or web address]  Larry Mcgee is an 24 y.o. male.  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 He is maintained on mycophenolate, tacrolimus, prednisone 7m.  Comes to ED on 12-1 with f/c, R flank pain, dysuria. His WBC was 17. He was started on merrem. This was changed to vancomycin today after BCx and UCx were positive.   BCx 1/2 Staph aureus UCx- staph aureus  Previously hospitalized in [place] Past Medical History  Diagnosis Date  . Congenital renal anomaly   . Hypertension   . Headache   . Anemia     Past Surgical History  Procedure Laterality Date  . Kidney transplant    . Eye surgery       Allergies  Allergen Reactions  . Ampicillin Itching and Rash  . Codeine Itching and Rash  . Latex Itching and Rash    Medications:  Scheduled: . acyclovir  200 mg Oral BID  . aspirin  81 mg Oral Daily  . enoxaparin (LOVENOX) injection  40 mg Subcutaneous Q24H  . lisinopril  5 mg Oral Daily  . mycophenolate  750 mg Oral BID  . predniSONE  5 mg Oral Q breakfast  . tacrolimus  4 mg Oral BID  . vancomycin  1,000 mg Intravenous Q8H    Abtx:  Anti-infectives    Start     Dose/Rate Route Frequency Ordered Stop   01/23/15 1900  vancomycin (VANCOCIN) IVPB 1000 mg/200 mL premix     1,000 mg 200 mL/hr over 60 Minutes Intravenous Every 8 hours 01/23/15 1730     01/23/15 1000  acyclovir (ZOVIRAX) 200 MG capsule 200 mg     200 mg Oral 2 times daily  01/23/15 0251     01/23/15 0400  meropenem (MERREM) 1 g in sodium chloride 0.9 % 100 mL IVPB  Status:  Discontinued    Comments:  Meropenem 1 g IV q8h for CrCl > 50 mL/min   1 g 200 mL/hr over 30 Minutes Intravenous 3 times per day 01/23/15 0257 01/24/15 1417   01/23/15 0100  levofloxacin (LEVAQUIN) IVPB 500 mg  Status:  Discontinued     500 mg 100 mL/hr over 60 Minutes Intravenous Daily at bedtime 01/23/15 0021 01/23/15 0251      Total days of antibiotics: 1          Social History:  reports that he has never smoked. He does not have any smokeless tobacco history on file. He reports that he drinks about 9.0 oz of alcohol per week. He reports that he uses illicit drugs (Marijuana).  Family History  Problem Relation Age of Onset  . Diabetes Mellitus II Neg Hx   . Hypertension Neg Hx     General ROS: f/c better, abd pain better, no dysuria, loose BM this AM, normal yesterday.  Please see HPI. 12 point ROS o/w (-)  Blood pressure 117/63, pulse 85, temperature 98.6 F (37 C), temperature source Oral, resp. rate 20, height _0  (1.753 m), weight 87.5 kg (  192 lb 14.4 oz), SpO2 98 %. General appearance: alert, cooperative and no distress Eyes: negative findings: conjunctivae and sclerae normal, pupils equal, round, reactive to light and accomodation and mild disconjugate gaze Throat: lips, mucosa, and tongue normal; teeth and gums normal Neck: no adenopathy and supple, symmetrical, trachea midline Lungs: clear to auscultation bilaterally Heart: regular rate and rhythm Abdomen: normal findings: bowel sounds normal and soft, non-tender Extremities: edema none   Results for orders placed or performed during the hospital encounter of 01/22/15 (from the past 48 hour(s))  I-stat troponin, ED     Status: None   Collection Time: 01/22/15  8:54 PM  Result Value Ref Range   Troponin i, poc 0.01 0.00 - 0.08 ng/mL   Comment 3            Comment: Due to the release kinetics of cTnI, a  negative result within the first hours of the onset of symptoms does not rule out myocardial infarction with certainty. If myocardial infarction is still suspected, repeat the test at appropriate intervals.   POCT i-Stat troponin I     Status: None   Collection Time: 01/22/15  8:54 PM  Result Value Ref Range   Troponin i, poc 0.01 0.00 - 0.08 ng/mL    Comment: PATIENT IDENTIFICATION ERROR. PLEASE DISREGARD RESULTS. ACCOUNT WILL BE CREDITED.   Comment 3            Comment: Due to the release kinetics of cTnI, a negative result within the first hours of the onset of symptoms does not rule out myocardial infarction with certainty. If myocardial infarction is still suspected, repeat the test at appropriate intervals.   I-stat chem 8, ed     Status: Abnormal   Collection Time: 01/22/15  8:56 PM  Result Value Ref Range   Sodium 137 135 - 145 mmol/L   Potassium 3.6 3.5 - 5.1 mmol/L   Chloride 98 (L) 101 - 111 mmol/L   BUN 14 6 - 20 mg/dL   Creatinine, Ser 0.90 0.61 - 1.24 mg/dL   Glucose, Bld 110 (H) 65 - 99 mg/dL   Calcium, Ion 1.21 1.12 - 1.23 mmol/L   TCO2 24 0 - 100 mmol/L   Hemoglobin 17.3 (H) 13.0 - 17.0 g/dL   HCT 51.0 39.0 - 52.0 %  I-STAT, chem 8     Status: Abnormal   Collection Time: 01/22/15  8:56 PM  Result Value Ref Range   Sodium 137 135 - 145 mmol/L    Comment: PATIENT IDENTIFICATION ERROR. PLEASE DISREGARD RESULTS. ACCOUNT WILL BE CREDITED.   Potassium 3.6 3.5 - 5.1 mmol/L   Chloride 98 (L) 101 - 111 mmol/L   BUN 14 6 - 20 mg/dL   Creatinine, Ser 0.90 0.61 - 1.24 mg/dL   Glucose, Bld 110 (H) 65 - 99 mg/dL   Calcium, Ion 1.21 1.12 - 1.23 mmol/L   TCO2 24 0 - 100 mmol/L   Hemoglobin 17.3 (H) 13.0 - 17.0 g/dL   HCT 51.0 39.0 - 52.0 %    Comment: PATIENT IDENTIFICATION ERROR. PLEASE DISREGARD RESULTS. ACCOUNT WILL BE CREDITED.  Lipase, blood     Status: None   Collection Time: 01/22/15  9:06 PM  Result Value Ref Range   Lipase 23 11 - 51 U/L  Comprehensive  metabolic panel     Status: Abnormal   Collection Time: 01/22/15  9:06 PM  Result Value Ref Range   Sodium 135 135 - 145 mmol/L   Potassium 3.6 3.5 -  5.1 mmol/L   Chloride 101 101 - 111 mmol/L   CO2 25 22 - 32 mmol/L   Glucose, Bld 113 (H) 65 - 99 mg/dL   BUN 10 6 - 20 mg/dL   Creatinine, Ser 0.98 0.61 - 1.24 mg/dL   Calcium 9.7 8.9 - 10.3 mg/dL   Total Protein 7.5 6.5 - 8.1 g/dL   Albumin 4.1 3.5 - 5.0 g/dL   AST 66 (H) 15 - 41 U/L   ALT 67 (H) 17 - 63 U/L   Alkaline Phosphatase 75 38 - 126 U/L   Total Bilirubin 1.0 0.3 - 1.2 mg/dL   GFR calc non Af Amer >60 >60 mL/min   GFR calc Af Amer >60 >60 mL/min    Comment: (NOTE) The eGFR has been calculated using the CKD EPI equation. This calculation has not been validated in all clinical situations. eGFR's persistently <60 mL/min signify possible Chronic Kidney Disease.    Anion gap 9 5 - 15  CBC     Status: Abnormal   Collection Time: 01/22/15  9:06 PM  Result Value Ref Range   WBC 17.0 (H) 4.0 - 10.5 K/uL   RBC 5.39 4.22 - 5.81 MIL/uL   Hemoglobin 15.5 13.0 - 17.0 g/dL   HCT 46.4 39.0 - 52.0 %   MCV 86.1 78.0 - 100.0 fL   MCH 28.8 26.0 - 34.0 pg   MCHC 33.4 30.0 - 36.0 g/dL   RDW 13.0 11.5 - 15.5 %   Platelets 199 150 - 400 K/uL  Blood culture (routine x 2)     Status: None (Preliminary result)   Collection Time: 01/22/15  9:10 PM  Result Value Ref Range   Specimen Description BLOOD LEFT FOREARM    Special Requests BOTTLES DRAWN AEROBIC AND ANAEROBIC 10CC    Culture  Setup Time      GRAM POSITIVE COCCI IN PAIRS IN BOTH AEROBIC AND ANAEROBIC BOTTLES CRITICAL RESULT CALLED TO, READ BACK BY AND VERIFIED WITH: A KIRKMAN,RN AT 5956 01/23/15 BY L BENFIELD    Culture STAPHYLOCOCCUS AUREUS    Report Status PENDING   I-Stat CG4 Lactic Acid, ED  (not at [facility])     Status: None   Collection Time: 01/22/15  9:11 PM  Result Value Ref Range   Lactic Acid, Venous 1.06 0.5 - 2.0 mmol/L  Blood culture (routine x 2)     Status: None  (Preliminary result)   Collection Time: 01/22/15  9:17 PM  Result Value Ref Range   Specimen Description BLOOD RIGHT ARM    Special Requests BOTTLES DRAWN AEROBIC AND ANAEROBIC 5CC    Culture NO GROWTH 2 DAYS    Report Status PENDING   Urinalysis, Routine w reflex microscopic (not at [facility])     Status: Abnormal   Collection Time: 01/22/15 11:11 PM  Result Value Ref Range   Color, Urine YELLOW YELLOW   APPearance CLEAR CLEAR   Specific Gravity, Urine 1.009 1.005 - 1.030   pH 6.5 5.0 - 8.0   Glucose, UA NEGATIVE NEGATIVE mg/dL   Hgb urine dipstick SMALL (A) NEGATIVE   Bilirubin Urine NEGATIVE NEGATIVE   Ketones, ur 15 (A) NEGATIVE mg/dL   Protein, ur NEGATIVE NEGATIVE mg/dL   Nitrite NEGATIVE NEGATIVE   Leukocytes, UA MODERATE (A) NEGATIVE  Urine microscopic-add on     Status: Abnormal   Collection Time: 01/22/15 11:11 PM  Result Value Ref Range   Squamous Epithelial / LPF 0-5 (A) NONE SEEN   WBC, UA 6-30  0 - 5 WBC/hpf   RBC / HPF 0-5 0 - 5 RBC/hpf   Bacteria, UA FEW (A) NONE SEEN  I-Stat CG4 Lactic Acid, ED  (not at [facility])     Status: None   Collection Time: 01/22/15 11:28 PM  Result Value Ref Range   Lactic Acid, Venous 0.81 0.5 - 2.0 mmol/L  CBC with Differential     Status: Abnormal   Collection Time: 01/23/15  3:56 AM  Result Value Ref Range   WBC 14.9 (H) 4.0 - 10.5 K/uL   RBC 4.81 4.22 - 5.81 MIL/uL   Hemoglobin 14.1 13.0 - 17.0 g/dL   HCT 41.8 39.0 - 52.0 %   MCV 86.9 78.0 - 100.0 fL   MCH 29.3 26.0 - 34.0 pg   MCHC 33.7 30.0 - 36.0 g/dL   RDW 13.0 11.5 - 15.5 %   Platelets 179 150 - 400 K/uL   Neutrophils Relative % 76 %   Neutro Abs 11.3 (H) 1.7 - 7.7 K/uL   Lymphocytes Relative 8 %   Lymphs Abs 1.2 0.7 - 4.0 K/uL   Monocytes Relative 16 %   Monocytes Absolute 2.4 (H) 0.1 - 1.0 K/uL   Eosinophils Relative 0 %   Eosinophils Absolute 0.0 0.0 - 0.7 K/uL   Basophils Relative 0 %   Basophils Absolute 0.0 0.0 - 0.1 K/uL  Comprehensive metabolic panel     Status:  Abnormal   Collection Time: 01/23/15  3:56 AM  Result Value Ref Range   Sodium 137 135 - 145 mmol/L   Potassium 3.5 3.5 - 5.1 mmol/L   Chloride 105 101 - 111 mmol/L   CO2 26 22 - 32 mmol/L   Glucose, Bld 124 (H) 65 - 99 mg/dL   BUN 9 6 - 20 mg/dL   Creatinine, Ser 1.13 0.61 - 1.24 mg/dL   Calcium 8.5 (L) 8.9 - 10.3 mg/dL   Total Protein 5.9 (L) 6.5 - 8.1 g/dL   Albumin 3.1 (L) 3.5 - 5.0 g/dL   AST 45 (H) 15 - 41 U/L   ALT 47 17 - 63 U/L   Alkaline Phosphatase 58 38 - 126 U/L   Total Bilirubin 1.0 0.3 - 1.2 mg/dL   GFR calc non Af Amer >60 >60 mL/min   GFR calc Af Amer >60 >60 mL/min    Comment: (NOTE) The eGFR has been calculated using the CKD EPI equation. This calculation has not been validated in all clinical situations. eGFR's persistently <60 mL/min signify possible Chronic Kidney Disease.    Anion gap 6 5 - 15  Lactic acid, plasma     Status: None   Collection Time: 01/23/15  3:56 AM  Result Value Ref Range   Lactic Acid, Venous 0.8 0.5 - 2.0 mmol/L  Procalcitonin     Status: None   Collection Time: 01/23/15  3:56 AM  Result Value Ref Range   Procalcitonin <0.10 ng/mL  Basic metabolic panel     Status: Abnormal   Collection Time: 01/24/15  6:58 AM  Result Value Ref Range   Sodium 137 135 - 145 mmol/L   Potassium 3.7 3.5 - 5.1 mmol/L   Chloride 100 (L) 101 - 111 mmol/L   CO2 26 22 - 32 mmol/L   Glucose, Bld 98 65 - 99 mg/dL   BUN <5 (L) 6 - 20 mg/dL   Creatinine, Ser 0.96 0.61 - 1.24 mg/dL   Calcium 9.3 8.9 - 10.3 mg/dL   GFR calc non Af Amer >60 >  60 mL/min   GFR calc Af Amer >60 >60 mL/min    Comment: (NOTE) The eGFR has been calculated using the CKD EPI equation. This calculation has not been validated in all clinical situations. eGFR's persistently <60 mL/min signify possible Chronic Kidney Disease.    Anion gap 11 5 - 15  CBC     Status: Abnormal   Collection Time: 01/24/15  6:58 AM  Result Value Ref Range   WBC 12.6 (H) 4.0 - 10.5 K/uL   RBC 5.06  4.22 - 5.81 MIL/uL   Hemoglobin 14.7 13.0 - 17.0 g/dL   HCT 44.0 39.0 - 52.0 %   MCV 87.0 78.0 - 100.0 fL   MCH 29.1 26.0 - 34.0 pg   MCHC 33.4 30.0 - 36.0 g/dL   RDW 13.4 11.5 - 15.5 %   Platelets 156 150 - 400 K/uL      Component Value Date/Time   SDES BLOOD RIGHT ARM 01/22/2015 2117   SPECREQUEST BOTTLES DRAWN AEROBIC AND ANAEROBIC 5CC 01/22/2015 2117   CULT NO GROWTH 2 DAYS 01/22/2015 2117   REPTSTATUS PENDING 01/22/2015 2117   Dg Chest Port 1 View  01/22/2015  CLINICAL DATA:  Right lower quadrant pain, cough, fever. Renal transplant. EXAM: PORTABLE CHEST 1 VIEW COMPARISON:  None. FINDINGS: A single AP portable view of the chest demonstrates no focal airspace consolidation or alveolar edema. The lungs are grossly clear. There is no large effusion or pneumothorax. Cardiac and mediastinal contours appear unremarkable. IMPRESSION: No active disease. Electronically Signed   By: Andreas Newport M.D.   On: 01/22/2015 21:40   Recent Results (from the past 240 hour(s))  Urine culture     Status: None (Preliminary result)   Collection Time: 01/22/15  8:58 PM  Result Value Ref Range Status   Specimen Description URINE, CLEAN CATCH  Final   Special Requests NONE  Final   Culture >=100,000 COLONIES/mL STAPHYLOCOCCUS AUREUS  Final   Report Status PENDING  Incomplete  Blood culture (routine x 2)     Status: None (Preliminary result)   Collection Time: 01/22/15  9:10 PM  Result Value Ref Range Status   Specimen Description BLOOD LEFT FOREARM  Final   Special Requests BOTTLES DRAWN AEROBIC AND ANAEROBIC 10CC  Final   Culture  Setup Time   Final    GRAM POSITIVE COCCI IN PAIRS IN BOTH AEROBIC AND ANAEROBIC BOTTLES CRITICAL RESULT CALLED TO, READ BACK BY AND VERIFIED WITH: A KIRKMAN,RN AT 2119 01/23/15 BY L BENFIELD    Culture STAPHYLOCOCCUS AUREUS  Final   Report Status PENDING  Incomplete  Blood culture (routine x 2)     Status: None (Preliminary result)   Collection Time: 01/22/15   9:17 PM  Result Value Ref Range Status   Specimen Description BLOOD RIGHT ARM  Final   Special Requests BOTTLES DRAWN AEROBIC AND ANAEROBIC 5CC  Final   Culture NO GROWTH 2 DAYS  Final   Report Status PENDING  Incomplete      01/24/2015, 2:54 PM     LOS: 1 day    Records and images were personally reviewed where available.       [place] [facility] Team Staphylococcus aureus bacteremia   Staphylococcus aureus bacteremia (SAB) is associated with a high rate of complications and mortality.  Specific aspects of clinical management are critical to optimizing the outcome of patients with SAB.  Therefore, the [facility] Antimicrobial Management Team [facility]) has initiated an intervention aimed at improving the management  of SAB at [facility].  To do so, Infectious Diseases physicians are providing an evidence-based consult for the management of all patients with SAB.     Yes No Comments  Perform follow-up blood cultures (even if the patient is afebrile) to ensure clearance of bacteremia _0  _1    Remove vascular catheter and obtain follow-up blood cultures after the removal of the catheter _2  _3    Perform echocardiography to evaluate for endocarditis (transthoracic ECHO is 40-50% sensitive, TEE is > 90% sensitive) _4  _5  Please keep in mind, that neither test can definitively EXCLUDE endocarditis, and that should clinical suspicion remain high for endocarditis the patient should then still be treated with an "endocarditis" duration of therapy = 6 weeks  Consult electrophysiologist to evaluate implanted cardiac device (pacemaker, ICD) _6  _7    Ensure source control _8  _9  Have all abscesses been drained effectively? Have deep seeded infections (septic joints or osteomyelitis) had appropriate surgical debridement?  Investigate for "metastatic" sites of infection _10  _11  Does the patient have ANY symptom or physical exam finding that would suggest a deeper infection (back or neck  pain that may be suggestive of vertebral osteomyelitis or epidural abscess, muscle pain that could be a symptom of pyomyositis)?  Keep in mind that for deep seeded infections MRI imaging with contrast is preferred rather than other often insensitive tests such as plain x-rays, especially early in a patient's presentation.  Change antibiotic therapy to __________________ _12  _13  Beta-lactam antibiotics are preferred for MSSA due to higher cure rates.   If on Vancomycin, goal trough should be 15 - 20 mcg/mL  Estimated duration of IV antibiotic therapy:   _14  _15  Consult case management for probably prolonged outpatient IV antibiotic therapy

## 2015-01-24 NOTE — Progress Notes (Signed)
PATIENT DETAILS Name: Larry Mcgee Age: 24 y.o. Sex: male Date of Birth: 05/09/1990 Admit Date: 01/22/2015 Admitting Physician Eduard Clos, MD PCP:No primary care provider on file.  Subjective: Right-sided flank pain better-fever curve improving  Assessment/Plan: Principal Problem: Sepsis: Initially thought to be secondary to pyelonephritis/gram-negative organisms-but now both blood and urine cultures positive for Staphylococcus. Discontinue meropenem, continue IV vancomycin.  ID consulted. Follow final blood/urine cultures. Improved, fever curve significantly better, leukocytosis is down trending. Looks nontoxic.  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 Check transthoracic echo  History of renal transplant: Continue prednisone, CellCept and tacrolimus.  Essential hypertension: Continue lisinopril  Hx of posterior urethral valves s/p multiple surgerys (L nephrectomy at age 59, appendicovesicotomy at age 45, and R nephrectomy with renal transplant with 2 pediatric kidneys at age 53): Regularly follows up with urology-since just moved [place] scheduled a appointment with urologist (Dr. Sherron Monday).  Disposition: Remain inpatient-home in the next few days  Antimicrobial agents  See below  Anti-infectives    Start     Dose/Rate Route Frequency Ordered Stop   01/23/15 1900  vancomycin (VANCOCIN) IVPB 1000 mg/200 mL premix     1,000 mg 200 mL/hr over 60 Minutes Intravenous Every 8 hours 01/23/15 1730     01/23/15 1000  acyclovir (ZOVIRAX) 200 MG capsule 200 mg     200 mg Oral 2 times daily 01/23/15 0251     01/23/15 0400  meropenem (MERREM) 1 g in sodium chloride 0.9 % 100 mL IVPB    Comments:  Meropenem 1 g IV q8h for CrCl > 50 mL/min   1 g 200 mL/hr over 30 Minutes Intravenous 3 times per day  01/23/15 0257     01/23/15 0100  levofloxacin (LEVAQUIN) IVPB 500 mg  Status:  Discontinued     500 mg 100 mL/hr over 60 Minutes Intravenous Daily at bedtime 01/23/15 0021 01/23/15 0251      DVT Prophylaxis: Prophylactic Lovenox   Code Status: Full code   Family Communication None at bedside  Procedures: None  CONSULTS:  None  Time spent 30 minutes-Greater than 50% of this time was spent in counseling, explanation of diagnosis, planning of further management, and coordination of care  MEDICATIONS: Scheduled Meds: . acyclovir  200 mg Oral BID  . aspirin  81 mg Oral Daily  . enoxaparin (LOVENOX) injection  40 mg Subcutaneous Q24H  . lisinopril  5 mg Oral Daily  . meropenem (MERREM) IV  1 g Intravenous 3 times per day  . mycophenolate  750 mg Oral BID  . predniSONE  5 mg Oral Q breakfast  . tacrolimus  4 mg Oral BID  . vancomycin  1,000 mg Intravenous Q8H   Continuous Infusions: . sodium chloride 75 mL/hr at 01/24/15 0745   PRN Meds:.acetaminophen **OR** acetaminophen, ondansetron **OR** ondansetron (ZOFRAN) IV    PHYSICAL EXAM: Vital signs in last 24 hours: Filed Vitals:   01/23/15 1729 01/23/15 1802 01/23/15 2015 01/24/15 0523  BP: 116/61  115/68 117/63  Pulse: 102   85  Temp:  100 F (37.8 C) 99.8 F (37.7 C) 98.6 F (37 C)  TempSrc: Oral  Oral Oral  Resp: Height:      Weight:      SpO2: 97%  98% 98%    Weight change:  [facility]  Weights   01/23/15 0151  Weight: 87.5 kg (192 lb 14.4 oz)   Body mass index is 28.47 kg/(m^2).   Gen Exam: Awake and alert with clear speech.   Neck: Supple, No JVD.   Chest: B/L Clear.   CVS: S1 S2 Regular, no murmurs.  Abdomen: soft, BS +, non tender, non distended. Tender Right flank Extremities: no edema, lower extremities warm to touch. Neurologic: Non Focal.   Skin: No Rash.   Wounds: N/A.    Intake/Output from previous day:  Intake/Output Summary (Last 24 hours) at 01/24/15 1411 Last data filed at  01/24/15 1914  Gross per 24 hour  Intake 2377.08 ml  Output      0 ml  Net 2377.08 ml     LAB RESULTS: CBC  Recent Labs Lab 01/22/15 2056 01/22/15 2106 01/23/15 0356 01/24/15 0658  WBC  --  17.0* 14.9* 12.6*  HGB 17.3*  17.3* 15.5 14.1 14.7  HCT 51.0  51.0 46.4 41.8 44.0  PLT  --  199 179 156  MCV  --  86.1 86.9 87.0  MCH  --  28.8 29.3 29.1  MCHC  --  33.4 33.7 33.4  RDW  --  13.0 13.0 13.4  LYMPHSABS  --   --  1.2  --   MONOABS  --   --  2.4*  --   EOSABS  --   --  0.0  --   BASOSABS  --   --  0.0  --     Chemistries   Recent Labs Lab 01/22/15 2056 01/22/15 2106 01/23/15 0356 01/24/15 0658  NA 137  137 135 137 137  K 3.6  3.6 3.6 3.5 3.7  CL 98*  98* 101 105 100*  CO2  --  GLUCOSE 110*  110* 113* 124* 98  BUN <5*  CREATININE 0.90  0.90 0.98 1.13 0.96  CALCIUM  --  9.7 8.5* 9.3    CBG: No results for input(s): GLUCAP in the last 168 hours.  GFR Estimated Creatinine Clearance: 132.1 mL/min (by C-G formula based on Cr of 0.96).  Coagulation profile No results for input(s): INR, PROTIME in the last 168 hours.  Cardiac Enzymes No results for input(s): CKMB, TROPONINI, MYOGLOBIN in the last 168 hours.  Invalid input(s): CK  Invalid input(s): POCBNP No results for input(s): DDIMER in the last 72 hours. No results for input(s): HGBA1C in the last 72 hours. No results for input(s): CHOL, HDL, LDLCALC, TRIG, CHOLHDL, LDLDIRECT in the last 72 hours. No results for input(s): TSH, T4TOTAL, T3FREE, THYROIDAB in the last 72 hours.  Invalid input(s): FREET3 No results for input(s): VITAMINB12, FOLATE, FERRITIN, TIBC, IRON, RETICCTPCT in the last 72 hours.  Recent Labs  01/22/15 2106  LIPASE 23    Urine Studies No results for input(s): UHGB, CRYS in the last 72 hours.  Invalid input(s): UACOL, UAPR, USPG, UPH, UTP, UGL, UKET, UBIL, UNIT, UROB, ULEU, UEPI, UWBC, URBC, UBAC, CAST, UCOM, BILUA  MICROBIOLOGY: Recent  Results (from the past 240 hour(s))  Urine culture     Status: None (Preliminary result)   Collection Time: 01/22/15  8:58 PM  Result Value Ref Range Status   Specimen Description URINE, CLEAN CATCH  Final   Special Requests NONE  Final   Culture >=100,000 COLONIES/mL STAPHYLOCOCCUS AUREUS  Final   Report Status PENDING  Incomplete  Blood culture (routine x 2)     Status: None (Preliminary result)  Collection Time: 01/22/15  9:10 PM  Result Value Ref Range Status   Specimen Description BLOOD LEFT FOREARM  Final   Special Requests BOTTLES DRAWN AEROBIC AND ANAEROBIC 10CC  Final   Culture  Setup Time   Final    GRAM POSITIVE COCCI IN PAIRS IN BOTH AEROBIC AND ANAEROBIC BOTTLES CRITICAL RESULT CALLED TO, READ BACK BY AND VERIFIED WITH: A KIRKMAN,RN AT 1533 01/23/15 BY L BENFIELD    Culture STAPHYLOCOCCUS AUREUS  Final   Report Status PENDING  Incomplete  Blood culture (routine x 2)     Status: None (Preliminary result)   Collection Time: 01/22/15  9:17 PM  Result Value Ref Range Status   Specimen Description BLOOD RIGHT ARM  Final   Special Requests BOTTLES DRAWN AEROBIC AND ANAEROBIC 5CC  Final   Culture NO GROWTH 2 DAYS  Final   Report Status PENDING  Incomplete    RADIOLOGY STUDIES/RESULTS: Dg Chest Port 1 View  01/22/2015  CLINICAL DATA:  Right lower quadrant pain, cough, fever. Renal transplant. EXAM: PORTABLE CHEST 1 VIEW COMPARISON:  None. FINDINGS: A single AP portable view of the chest demonstrates no focal airspace consolidation or alveolar edema. The lungs are grossly clear. There is no large effusion or pneumothorax. Cardiac and mediastinal contours appear unremarkable. IMPRESSION: No active disease. Electronically Signed   By: Ellery Plunkaniel R Mitchell M.D.   On: 01/22/2015 21:40    Jeoffrey MassedGHIMIRE,SHANKER, MD  [facility] Pager:[phone]  If 7PM-7AM, please contact night-coverage [email or web address] Password [email or web address] 01/24/2015, 2:11 PM   LOS: 1 day

## 2015-01-25 ENCOUNTER — Inpatient Hospital Stay (HOSPITAL_COMMUNITY): Payer: 59

## 2015-01-25 LAB — CULTURE, BLOOD (ROUTINE X 2)

## 2015-01-25 LAB — BASIC METABOLIC PANEL
ANION GAP: 6 (ref 5–15)
BUN: 8 mg/dL (ref 6–20)
CALCIUM: 9.1 mg/dL (ref 8.9–10.3)
CHLORIDE: 104 mmol/L (ref 101–111)
CO2: 28 mmol/L (ref 22–32)
CREATININE: 0.96 mg/dL (ref 0.61–1.24)
GFR calc non Af Amer: >60 mL/min (ref >60)
Glucose, Bld: 102 mg/dL — ABNORMAL HIGH (ref 65–99)
Potassium: 4.2 mmol/L (ref 3.5–5.1)
SODIUM: 138 mmol/L (ref 135–145)

## 2015-01-25 LAB — URINE CULTURE

## 2015-01-25 LAB — HIV ANTIBODY (ROUTINE TESTING W REFLEX): HIV SCREEN 4TH GENERATION: NONREACTIVE

## 2015-01-25 LAB — CBC
HCT: 42.9 % (ref 39.0–52.0)
HEMOGLOBIN: 14.1 g/dL (ref 13.0–17.0)
MCH: 28.8 pg (ref 26.0–34.0)
MCHC: 32.9 g/dL (ref 30.0–36.0)
MCV: 87.6 fL (ref 78.0–100.0)
Platelets: 166 10*3/uL (ref 150–400)
RBC: 4.9 MIL/uL (ref 4.22–5.81)
RDW: 13.2 % (ref 11.5–15.5)
WBC: 11.2 10*3/uL — AB (ref 4.0–10.5)

## 2015-01-25 MED ORDER — CEFAZOLIN SODIUM 1-5 GM-% IV SOLN
1.0000 g | Freq: Three times a day (TID) | INTRAVENOUS | Status: DC
  Administered 2015-01-25 – 2015-01-27 (×6): 1 g via INTRAVENOUS
  Filled 2015-01-25 (×9): qty 50

## 2015-01-25 NOTE — Progress Notes (Signed)
*  PRELIMINARY RESULTS* Echocardiogram 2D Echocardiogram has been performed.  Larry Mcgee, Larry Mcgee 01/25/2015, 3:35 PM

## 2015-01-25 NOTE — Progress Notes (Signed)
PATIENT DETAILS Name: Larry Mcgee Age: 24 y.o. Sex: male Date of Birth: 1990/03/01 Admit Date: 01/22/2015 Admitting Physician Eduard ClosArshad N Kakrakandy, MD PCP:No primary care provider on file.  Subjective: Anxious to go home-afebrile this morning.   Assessment/Plan: Principal Problem: Sepsis secondary to MSSA bacteremia: Initially thought to be secondary to pyelonephritis/gram-negative organisms-but blood and urine cultures positive for MSSA. Antibiotics now narrowed to Ancef. 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 Overall much improved-leukocytosis resolved, no fever this morning.  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 Check transthoracic echo  History of renal transplant: Continue prednisone, CellCept and tacrolimus.  Essential hypertension: Continue lisinopril  Hx of posterior urethral valves s/p multiple surgerys (L nephrectomy at age 89, appendicovesicotomy at age 24, and R nephrectomy with renal transplant with 2 pediatric kidneys at age 89): Regularly follows up with urology-[place]has scheduled a appointment with urologist (Dr. Sherron MondayMacDiarmid). 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  Disposition: Remain inpatient-home when workup complete  Antimicrobial agents  See below  Anti-infectives    Start     Dose/Rate Route Frequency Ordered Stop   01/25/15 1400  ceFAZolin (ANCEF) IVPB 1 g/50 mL premix     1 g 100 mL/hr over 30 Minutes Intravenous 3 times per day 01/25/15 1219     01/24/15 2000  vancomycin (VANCOCIN) 1,250 mg in sodium chloride 0.9 % 250 mL IVPB  Status:  Discontinued     1,250 mg 166.7 mL/hr over 90 Minutes Intravenous Every 8 hours 01/24/15 1914 01/25/15 1219   01/23/15 1900  vancomycin (VANCOCIN) IVPB 1000 mg/200 mL premix  Status:  Discontinued     1,000 mg 200 mL/hr over 60 Minutes Intravenous Every 8 hours 01/23/15 1730 01/24/15 1914   01/23/15 1000  acyclovir (ZOVIRAX) 200 MG capsule 200 mg     200 mg Oral 2 times daily 01/23/15 0251     01/23/15 0400  meropenem (MERREM) 1 g in sodium chloride 0.9 % 100 mL IVPB  Status:  Discontinued    Comments:  Meropenem 1 g IV q8h for CrCl > 50 mL/min   1 g 200 mL/hr over 30 Minutes Intravenous 3 times per day 01/23/15 0257 01/24/15 1417   01/23/15 0100  levofloxacin (LEVAQUIN) IVPB 500 mg  Status:  Discontinued     500 mg 100 mL/hr over 60 Minutes Intravenous Daily at bedtime 01/23/15 0021 01/23/15 0251      DVT Prophylaxis: Prophylactic Lovenox   Code Status: Full code   Family Communication None at bedside  Procedures: None  CONSULTS:  None  Time spent 20 minutes-Greater than 50% of this time was spent in counseling, explanation of diagnosis, planning of further management, and coordination of care  MEDICATIONS: Scheduled Meds: . acyclovir  200 mg Oral BID  . aspirin  81 mg Oral Daily  .  ceFAZolin (ANCEF) IV  1 g Intravenous 3 times per day  . enoxaparin (LOVENOX) injection  40 mg Subcutaneous Q24H  . lisinopril  5 mg Oral QHS  . mycophenolate  750 mg Oral BID  . predniSONE  5 mg Oral Q breakfast  . tacrolimus  4 mg Oral BID   Continuous Infusions:   PRN Meds:.acetaminophen **OR** acetaminophen, ondansetron **OR** ondansetron (ZOFRAN) IV    PHYSICAL EXAM: Vital signs  in last 24 hours: Filed Vitals:   01/24/15 1700 01/24/15 2300 01/25/15 0505 01/25/15 0945  BP: 110/59  102/52 109/56  Pulse: 74  77 89  Temp: 97.7 F (36.5 C)  98.1 F (36.7 C) 98.6 F (37 C)  TempSrc: Oral  Oral Oral  Resp: Height:      Weight:  86.41 kg (190 lb 8 oz)    SpO2: 98%  97% 98%    Weight change:  Filed Weights   01/23/15 0151 01/24/15 2300  Weight: 87.5 kg (192 lb 14.4  oz) 86.41 kg (190 lb 8 oz)   Body mass index is 28.12 kg/(m^2).   Gen Exam: Awake and alert with clear speech.   Neck: Supple, No JVD.   Chest: B/L Clear.   CVS: S1 S2 Regular, no murmurs.  Abdomen: soft, BS +, non tender, non distended. Tender Right flank Extremities: no edema, lower extremities warm to touch. Neurologic: Non Focal.   Skin: No Rash.   Wounds: N/A.    Intake/Output from previous day:  Intake/Output Summary (Last 24 hours) at 01/25/15 1630 Last data filed at 01/25/15 1553  Gross per 24 hour  Intake   2060 ml  Output      0 ml  Net   2060 ml     LAB RESULTS: CBC  Recent Labs Lab 01/22/15 2056 01/22/15 2106 01/23/15 0356 01/24/15 0658 01/25/15 0407  WBC  --  17.0* 14.9* 12.6* 11.2*  HGB 17.3*  17.3* 15.5 14.1 14.7 14.1  HCT 51.0  51.0 46.4 41.8 44.0 42.9  PLT  --  199 179 156 166  MCV  --  86.1 86.9 87.0 87.6  MCH  --  28.8 29.3 29.1 28.8  MCHC  --  33.4 33.7 33.4 32.9  RDW  --  13.0 13.0 13.4 13.2  LYMPHSABS  --   --  1.2  --   --   MONOABS  --   --  2.4*  --   --   EOSABS  --   --  0.0  --   --   BASOSABS  --   --  0.0  --   --     Chemistries   Recent Labs Lab 01/22/15 2056 01/22/15 2106 01/23/15 0356 01/24/15 0658 01/25/15 0407  NA 137  137 135 137 137 138  K 3.6  3.6 3.6 3.5 3.7 4.2  CL 98*  98* 101 105 100* 104  CO2  --  GLUCOSE 110*  110* 113* 124* 98 102*  BUN <5* 8  CREATININE 0.90  0.90 0.98 1.13 0.96 0.96  CALCIUM  --  9.7 8.5* 9.3 9.1    CBG: No results for input(s): GLUCAP in the last 168 hours.  GFR Estimated Creatinine Clearance: 131.5 mL/min (by C-G formula based on Cr of 0.96).  Coagulation profile No results for input(s): INR, PROTIME in the last 168 hours.  Cardiac Enzymes No results for input(s): CKMB, TROPONINI, MYOGLOBIN in the last 168 hours.  Invalid input(s): CK  Invalid input(s): POCBNP No results for input(s): DDIMER in the last 72 hours. No results for  input(s): HGBA1C in the last 72 hours. No results for input(s): CHOL, HDL, LDLCALC, TRIG, CHOLHDL, LDLDIRECT in the last 72 hours. No results for input(s): TSH, T4TOTAL, T3FREE, THYROIDAB in the last 72 hours.  Invalid input(s): FREET3 No results for input(s): VITAMINB12, FOLATE, FERRITIN, TIBC, IRON, RETICCTPCT in the last  72 hours.  Recent Labs  01/22/15 2106  LIPASE 23    Urine Studies No results for input(s): UHGB, CRYS in the last 72 hours.  Invalid input(s): UACOL, UAPR, USPG, UPH, UTP, UGL, UKET, UBIL, UNIT, UROB, ULEU, UEPI, UWBC, URBC, UBAC, CAST, UCOM, BILUA  MICROBIOLOGY: Recent Results (from the past 240 hour(s))  Urine culture     Status: None   Collection Time: 01/22/15  8:58 PM  Result Value Ref Range Status   Specimen Description URINE, CLEAN CATCH  Final   Special Requests NONE  Final   Culture >=100,000 COLONIES/mL STAPHYLOCOCCUS AUREUS  Final   Report Status 01/25/2015 FINAL  Final   Organism ID, Bacteria STAPHYLOCOCCUS AUREUS  Final      Susceptibility   Staphylococcus aureus - MIC*    CIPROFLOXACIN <=0.5 SENSITIVE Sensitive     GENTAMICIN <=0.5 SENSITIVE Sensitive     NITROFURANTOIN <=16 SENSITIVE Sensitive     OXACILLIN 0.5 SENSITIVE Sensitive     TETRACYCLINE <=1 SENSITIVE Sensitive     VANCOMYCIN <=0.5 SENSITIVE Sensitive     TRIMETH/SULFA <=10 SENSITIVE Sensitive     CLINDAMYCIN <=0.25 SENSITIVE Sensitive     RIFAMPIN <=0.5 SENSITIVE Sensitive     Inducible Clindamycin NEGATIVE Sensitive     * >=100,000 COLONIES/mL STAPHYLOCOCCUS AUREUS  Blood culture (routine x 2)     Status: None   Collection Time: 01/22/15  9:10 PM  Result Value Ref Range Status   Specimen Description BLOOD LEFT FOREARM  Final   Special Requests BOTTLES DRAWN AEROBIC AND ANAEROBIC 10CC  Final   Culture  Setup Time   Final    GRAM POSITIVE COCCI IN PAIRS IN BOTH AEROBIC AND ANAEROBIC BOTTLES CRITICAL RESULT CALLED TO, READ BACK BY AND VERIFIED WITH: A [facility] AT 1533  01/23/15 BY L BENFIELD    Culture STAPHYLOCOCCUS AUREUS  Final   Report Status 01/25/2015 FINAL  Final   Organism ID, Bacteria STAPHYLOCOCCUS AUREUS  Final      Susceptibility   Staphylococcus aureus - MIC*    CIPROFLOXACIN <=0.5 SENSITIVE Sensitive     ERYTHROMYCIN <=0.25 SENSITIVE Sensitive     GENTAMICIN <=0.5 SENSITIVE Sensitive     OXACILLIN 0.5 SENSITIVE Sensitive     TETRACYCLINE <=1 SENSITIVE Sensitive     VANCOMYCIN 1 SENSITIVE Sensitive     TRIMETH/SULFA <=10 SENSITIVE Sensitive     CLINDAMYCIN <=0.25 SENSITIVE Sensitive     RIFAMPIN <=0.5 SENSITIVE Sensitive     Inducible Clindamycin NEGATIVE Sensitive     * STAPHYLOCOCCUS AUREUS  Blood culture (routine x 2)     Status: None (Preliminary result)   Collection Time: 01/22/15  9:17 PM  Result Value Ref Range Status   Specimen Description BLOOD RIGHT ARM  Final   Special Requests BOTTLES DRAWN AEROBIC AND ANAEROBIC 5CC  Final   Culture NO GROWTH 3 DAYS  Final   Report Status PENDING  Incomplete  Culture, blood (routine x 2)     Status: None (Preliminary result)   Collection Time: 01/24/15  6:31 PM  Result Value Ref Range Status   Specimen Description BLOOD RIGHT ARM  Final   Special Requests BOTTLES DRAWN AEROBIC AND ANAEROBIC 10CC  Final   Culture NO GROWTH < 24 HOURS  Final   Report Status PENDING  Incomplete    RADIOLOGY STUDIES/RESULTS: US Renal  01/24/2015  CLINICAL DATA:  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 EXAM: RENAL / URINARY TRACT ULTRASOUND COMPLETE COMPARISON:  None. FINDINGS: First  right pelvic transplant kidney: Length: 10.4 cm. Echogenicity within normal limits. No mass or hydronephrosis visualized. Normal internal blood flow with color Doppler. No peritransplant fluid. Second right pelvic transplant kidney: Length: 11.7 cm. Echogenicity within normal limits. No mass or hydronephrosis visualized. Normal internal blood flow with color Doppler. No peritransplant fluid. Bladder:  Rounded, heterogeneous, predominantly echogenic mass adherent to the bladder wall on the right, measuring 2.0 x 1.8 x 1.5 cm. No internal blood flow was seen within this mass with color Doppler. The native kidneys are surgically absent. IMPRESSION: 1. Two right pelvic transplant kidneys without hydronephrosis or peritransplant fluid. 2. 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 This could be related to the patient's previous surgery or represent an adherent, developing bladder calculus. Correlation with any previous available examinations would be helpful. Electronically Signed   By: Beckie Salts M.D.   On: 01/24/2015 20:56   Dg Chest Port 1 View  01/22/2015  CLINICAL DATA:  Right lower quadrant pain, cough, fever. Renal transplant. EXAM: PORTABLE CHEST 1 VIEW COMPARISON:  None. FINDINGS: A single AP portable view of the chest demonstrates no focal airspace consolidation or alveolar edema. The lungs are grossly clear. There is no large effusion or pneumothorax. Cardiac and mediastinal contours appear unremarkable. IMPRESSION: No active disease. Electronically Signed   By: Ellery Plunk M.D.   On: 01/22/2015 21:40    Jeoffrey Massed, MD  [facility] Pager:[phone]  If 7PM-7AM, please contact night-coverage [email or web address] Password [email or web address] 01/25/2015, 4:30 PM   LOS: 2 days

## 2015-01-25 NOTE — Consult Note (Signed)
Urology Consult  Referring physician: Oren Binet, MD Reason for referral: Bladder mass  Chief Complaint: Bladder mass  History of Present Illness:  24 yo M with complex past urologic history who was admitted on 01/22/15 with staph aureus sepsis of urinary origin. He has a history of PUVs and underwent nephrectomy, ureteral bladder augmentation, and ablation of PUVs at 24 years old, and then appendicovesicostomy formation at 24 years old. 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  He does have a history of recurrent UTIs. He has been on prophylactic nitrofurantoin for the past 6 months with no infections. He stopped the nitrofurantoin on 01/18/15. Prior to being on this he had been on prophylactic bactrim and was having UTIs every 2-3 months.   He catheterizes through his appendicovesicostomy 4-5x per day with 100-250 mL out. He voids per urethral between CIC. No difficulty with catheterizing. No history of bladder stones.  He is still followed by Dr. Rayburn Go, his urologist at [facility]. His last cystoscopy (performed at bedside with no problems via urethra) was in January 2016. No abnormalities noted at this time.  As part of the work up of the source of his infection he had a renal ultrasound which demonstrates a right sided round bladder mass with calcifications. Urology was consulted for evaluation of the mass.  Past Medical History  Diagnosis Date  . Congenital renal anomaly   . Hypertension   . Headache   . Anemia    Past Surgical History  Procedure Laterality Date  . Kidney transplant    . Eye surgery      Medications: I have reviewed the patient's current medications. Allergies:  Allergies  Allergen Reactions  . Ampicillin Itching and Rash  . Codeine Itching and Rash  . Latex Itching and Rash    Family History  Problem Relation Age of Onset  . Diabetes Mellitus II Neg Hx   . Hypertension Neg Hx    Social History:  reports that he has  never smoked. He does not have any smokeless tobacco history on file. He reports that he drinks about 9.0 oz of alcohol per week. He reports that he uses illicit drugs (Marijuana).  Review of Systems  Constitutional: Negative for fever and chills.  Respiratory: Negative for cough.   Cardiovascular: Negative for chest pain.  Gastrointestinal: Negative for nausea, vomiting, abdominal pain and diarrhea.  Genitourinary: Negative for dysuria, urgency, frequency, hematuria and flank pain.  Neurological: Negative for headaches.    Physical Exam:  Vital signs in last 24 hours: Temp:  [97.7 F (36.5 C)-98.6 F (37 C)] 98.6 F (37 C) (12/03 0945) Pulse Rate:  [74-89] 89 (12/03 0945) Resp:  [14-20] 16 (12/03 0945) BP: (102-110)/(52-59) 109/56 mmHg (12/03 0945) SpO2:  [97 %-98 %] 98 % (12/03 0945) Weight:  [86.41 kg (190 lb 8 oz)] 86.41 kg (190 lb 8 oz) (12/02 2300) Physical Exam  Constitutional: He is oriented to person, place, and time. He appears well-developed and well-nourished. No distress.  HENT:  Head: Normocephalic and atraumatic.  Cardiovascular: Normal rate and regular rhythm.   Respiratory: Effort normal and breath sounds normal.  GI: Soft. He exhibits no distension and no mass. There is tenderness. There is no rebound and no guarding.  Right lower quadrant is mild to moderately tender to palpation overlying his transplant kidneys. Incision over right lower quadrant from prior transplant. Appendicovesicostomy stoma is present in the midline below his umbilicus. Transverse lower abdominal scar from bladder augment &  appendicovesicostomy formation.  Neurological: He is alert and oriented to person, place, and time.  Skin: Skin is warm and dry. He is not diaphoretic.    Laboratory Data:  Results for orders placed or performed during the hospital encounter of 01/22/15 (from the past 72 hour(s))  I-stat troponin, ED     Status: None   Collection Time: 01/22/15  8:54 PM  Result Value  Ref Range   Troponin i, poc 0.01 0.00 - 0.08 ng/mL   Comment 3            Comment: Due to the release kinetics of cTnI, a negative result within the first hours of the onset of symptoms does not rule out myocardial infarction with certainty. If myocardial infarction is still suspected, repeat the test at appropriate intervals.   POCT i-Stat troponin I     Status: None   Collection Time: 01/22/15  8:54 PM  Result Value Ref Range   Troponin i, poc 0.01 0.00 - 0.08 ng/mL    Comment: PATIENT IDENTIFICATION ERROR. PLEASE DISREGARD RESULTS. ACCOUNT WILL BE CREDITED.   Comment 3            Comment: Due to the release kinetics of cTnI, a negative result within the first hours of the onset of symptoms does not rule out myocardial infarction with certainty. If myocardial infarction is still suspected, repeat the test at appropriate intervals.   I-stat chem 8, ed     Status: Abnormal   Collection Time: 01/22/15  8:56 PM  Result Value Ref Range   Sodium 137 135 - 145 mmol/L   Potassium 3.6 3.5 - 5.1 mmol/L   Chloride 98 (L) 101 - 111 mmol/L   BUN 14 6 - 20 mg/dL   Creatinine, Ser 0.90 0.61 - 1.24 mg/dL   Glucose, Bld 110 (H) 65 - 99 mg/dL   Calcium, Ion 1.21 1.12 - 1.23 mmol/L   TCO2 24 0 - 100 mmol/L   Hemoglobin 17.3 (H) 13.0 - 17.0 g/dL   HCT 51.0 39.0 - 52.0 %  I-STAT, chem 8     Status: Abnormal   Collection Time: 01/22/15  8:56 PM  Result Value Ref Range   Sodium 137 135 - 145 mmol/L    Comment: PATIENT IDENTIFICATION ERROR. PLEASE DISREGARD RESULTS. ACCOUNT WILL BE CREDITED.   Potassium 3.6 3.5 - 5.1 mmol/L   Chloride 98 (L) 101 - 111 mmol/L   BUN 14 6 - 20 mg/dL   Creatinine, Ser 0.90 0.61 - 1.24 mg/dL   Glucose, Bld 110 (H) 65 - 99 mg/dL   Calcium, Ion 1.21 1.12 - 1.23 mmol/L   TCO2 24 0 - 100 mmol/L   Hemoglobin 17.3 (H) 13.0 - 17.0 g/dL   HCT 51.0 39.0 - 52.0 %    Comment: PATIENT IDENTIFICATION ERROR. PLEASE DISREGARD RESULTS. ACCOUNT WILL BE CREDITED.  Lipase,  blood     Status: None   Collection Time: 01/22/15  9:06 PM  Result Value Ref Range   Lipase 23 11 - 51 U/L  Comprehensive metabolic panel     Status: Abnormal   Collection Time: 01/22/15  9:06 PM  Result Value Ref Range   Sodium 135 135 - 145 mmol/L   Potassium 3.6 3.5 - 5.1 mmol/L   Chloride 101 101 - 111 mmol/L   CO2 25 22 - 32 mmol/L   Glucose, Bld 113 (H) 65 - 99 mg/dL   BUN 10 6 - 20 mg/dL   Creatinine, Ser 0.98 0.61 -  1.24 mg/dL   Calcium 9.7 8.9 - 10.3 mg/dL   Total Protein 7.5 6.5 - 8.1 g/dL   Albumin 4.1 3.5 - 5.0 g/dL   AST 66 (H) 15 - 41 U/L   ALT 67 (H) 17 - 63 U/L   Alkaline Phosphatase 75 38 - 126 U/L   Total Bilirubin 1.0 0.3 - 1.2 mg/dL   GFR calc non Af Amer >60 >60 mL/min   GFR calc Af Amer >60 >60 mL/min    Comment: (NOTE) The eGFR has been calculated using the CKD EPI equation. This calculation has not been validated in all clinical situations. eGFR's persistently <60 mL/min signify possible Chronic Kidney Disease.    Anion gap 9 5 - 15  CBC     Status: Abnormal   Collection Time: 01/22/15  9:06 PM  Result Value Ref Range   WBC 17.0 (H) 4.0 - 10.5 K/uL   RBC 5.39 4.22 - 5.81 MIL/uL   Hemoglobin 15.5 13.0 - 17.0 g/dL   HCT 46.4 39.0 - 52.0 %   MCV 86.1 78.0 - 100.0 fL   MCH 28.8 26.0 - 34.0 pg   MCHC 33.4 30.0 - 36.0 g/dL   RDW 13.0 11.5 - 15.5 %   Platelets 199 150 - 400 K/uL  Blood culture (routine x 2)     Status: None   Collection Time: 01/22/15  9:10 PM  Result Value Ref Range   Specimen Description BLOOD LEFT FOREARM    Special Requests BOTTLES DRAWN AEROBIC AND ANAEROBIC 10CC    Culture  Setup Time      GRAM POSITIVE COCCI IN PAIRS IN BOTH AEROBIC AND ANAEROBIC BOTTLES CRITICAL RESULT CALLED TO, READ BACK BY AND VERIFIED WITH: A [facility] AT 1533 01/23/15 BY L BENFIELD    Culture STAPHYLOCOCCUS AUREUS    Report Status 01/25/2015 FINAL    Organism ID, Bacteria STAPHYLOCOCCUS AUREUS       Susceptibility   Staphylococcus aureus - MIC*     CIPROFLOXACIN <=0.5 SENSITIVE Sensitive     ERYTHROMYCIN <=0.25 SENSITIVE Sensitive     GENTAMICIN <=0.5 SENSITIVE Sensitive     OXACILLIN 0.5 SENSITIVE Sensitive     TETRACYCLINE <=1 SENSITIVE Sensitive     VANCOMYCIN 1 SENSITIVE Sensitive     TRIMETH/SULFA <=10 SENSITIVE Sensitive     CLINDAMYCIN <=0.25 SENSITIVE Sensitive     RIFAMPIN <=0.5 SENSITIVE Sensitive     Inducible Clindamycin NEGATIVE Sensitive     * STAPHYLOCOCCUS AUREUS  I-Stat CG4 Lactic Acid, ED  (not at [facility])     Status: None   Collection Time: 01/22/15  9:11 PM  Result Value Ref Range   Lactic Acid, Venous 1.06 0.5 - 2.0 mmol/L  Blood culture (routine x 2)     Status: None (Preliminary result)   Collection Time: 01/22/15  9:17 PM  Result Value Ref Range   Specimen Description BLOOD RIGHT ARM    Special Requests BOTTLES DRAWN AEROBIC AND ANAEROBIC 5CC    Culture NO GROWTH 2 DAYS    Report Status PENDING   Urinalysis, Routine w reflex microscopic (not at [facility])     Status: Abnormal   Collection Time: 01/22/15 11:11 PM  Result Value Ref Range   Color, Urine YELLOW YELLOW   APPearance CLEAR CLEAR   Specific Gravity, Urine 1.009 1.005 - 1.030   pH 6.5 5.0 - 8.0   Glucose, UA NEGATIVE NEGATIVE mg/dL   Hgb urine dipstick SMALL (A) NEGATIVE   Bilirubin Urine NEGATIVE NEGATIVE  Ketones, ur 15 (A) NEGATIVE mg/dL   Protein, ur NEGATIVE NEGATIVE mg/dL   Nitrite NEGATIVE NEGATIVE   Leukocytes, UA MODERATE (A) NEGATIVE  Urine microscopic-add on     Status: Abnormal   Collection Time: 01/22/15 11:11 PM  Result Value Ref Range   Squamous Epithelial / LPF 0-5 (A) NONE SEEN   WBC, UA 6-30 0 - 5 WBC/hpf   RBC / HPF 0-5 0 - 5 RBC/hpf   Bacteria, UA FEW (A) NONE SEEN  I-Stat CG4 Lactic Acid, ED  (not at [facility])     Status: None   Collection Time: 01/22/15 11:28 PM  Result Value Ref Range   Lactic Acid, Venous 0.81 0.5 - 2.0 mmol/L  CBC with Differential     Status: Abnormal   Collection Time: 01/23/15  3:56 AM  Result  Value Ref Range   WBC 14.9 (H) 4.0 - 10.5 K/uL   RBC 4.81 4.22 - 5.81 MIL/uL   Hemoglobin 14.1 13.0 - 17.0 g/dL   HCT 41.8 39.0 - 52.0 %   MCV 86.9 78.0 - 100.0 fL   MCH 29.3 26.0 - 34.0 pg   MCHC 33.7 30.0 - 36.0 g/dL   RDW 13.0 11.5 - 15.5 %   Platelets 179 150 - 400 K/uL   Neutrophils Relative % 76 %   Neutro Abs 11.3 (H) 1.7 - 7.7 K/uL   Lymphocytes Relative 8 %   Lymphs Abs 1.2 0.7 - 4.0 K/uL   Monocytes Relative 16 %   Monocytes Absolute 2.4 (H) 0.1 - 1.0 K/uL   Eosinophils Relative 0 %   Eosinophils Absolute 0.0 0.0 - 0.7 K/uL   Basophils Relative 0 %   Basophils Absolute 0.0 0.0 - 0.1 K/uL  Comprehensive metabolic panel     Status: Abnormal   Collection Time: 01/23/15  3:56 AM  Result Value Ref Range   Sodium 137 135 - 145 mmol/L   Potassium 3.5 3.5 - 5.1 mmol/L   Chloride 105 101 - 111 mmol/L   CO2 26 22 - 32 mmol/L   Glucose, Bld 124 (H) 65 - 99 mg/dL   BUN 9 6 - 20 mg/dL   Creatinine, Ser 1.13 0.61 - 1.24 mg/dL   Calcium 8.5 (L) 8.9 - 10.3 mg/dL   Total Protein 5.9 (L) 6.5 - 8.1 g/dL   Albumin 3.1 (L) 3.5 - 5.0 g/dL   AST 45 (H) 15 - 41 U/L   ALT 47 17 - 63 U/L   Alkaline Phosphatase 58 38 - 126 U/L   Total Bilirubin 1.0 0.3 - 1.2 mg/dL   GFR calc non Af Amer >60 >60 mL/min   GFR calc Af Amer >60 >60 mL/min    Comment: (NOTE) The eGFR has been calculated using the CKD EPI equation. This calculation has not been validated in all clinical situations. eGFR's persistently <60 mL/min signify possible Chronic Kidney Disease.    Anion gap 6 5 - 15  Lactic acid, plasma     Status: None   Collection Time: 01/23/15  3:56 AM  Result Value Ref Range   Lactic Acid, Venous 0.8 0.5 - 2.0 mmol/L  Procalcitonin     Status: None   Collection Time: 01/23/15  3:56 AM  Result Value Ref Range   Procalcitonin <0.10 ng/mL  Basic metabolic panel     Status: Abnormal   Collection Time: 01/24/15  6:58 AM  Result Value Ref Range   Sodium 137 135 - 145 mmol/L   Potassium 3.7  3.5 -  5.1 mmol/L   Chloride 100 (L) 101 - 111 mmol/L   CO2 26 22 - 32 mmol/L   Glucose, Bld 98 65 - 99 mg/dL   BUN <5 (L) 6 - 20 mg/dL   Creatinine, Ser 0.96 0.61 - 1.24 mg/dL   Calcium 9.3 8.9 - 10.3 mg/dL   GFR calc non Af Amer >60 >60 mL/min   GFR calc Af Amer >60 >60 mL/min    Comment: (NOTE) The eGFR has been calculated using the CKD EPI equation. This calculation has not been validated in all clinical situations. eGFR's persistently <60 mL/min signify possible Chronic Kidney Disease.    Anion gap 11 5 - 15  CBC     Status: Abnormal   Collection Time: 01/24/15  6:58 AM  Result Value Ref Range   WBC 12.6 (H) 4.0 - 10.5 K/uL   RBC 5.06 4.22 - 5.81 MIL/uL   Hemoglobin 14.7 13.0 - 17.0 g/dL   HCT 44.0 39.0 - 52.0 %   MCV 87.0 78.0 - 100.0 fL   MCH 29.1 26.0 - 34.0 pg   MCHC 33.4 30.0 - 36.0 g/dL   RDW 13.4 11.5 - 15.5 %   Platelets 156 150 - 400 K/uL  Vancomycin, trough     Status: Abnormal   Collection Time: 01/24/15  6:31 PM  Result Value Ref Range   Vancomycin Tr 7 (L) 10.0 - 20.0 ug/mL  HIV antibody     Status: None   Collection Time: 01/24/15  6:31 PM  Result Value Ref Range   HIV Screen 4th Generation wRfx Non Reactive Non Reactive    Comment: (NOTE) Performed At: [facility] [place], [place] [id number] Lindon Romp MD [id number]   CBC     Status: Abnormal   Collection Time: 01/25/15  4:07 AM  Result Value Ref Range   WBC 11.2 (H) 4.0 - 10.5 K/uL   RBC 4.90 4.22 - 5.81 MIL/uL   Hemoglobin 14.1 13.0 - 17.0 g/dL   HCT 42.9 39.0 - 52.0 %   MCV 87.6 78.0 - 100.0 fL   MCH 28.8 26.0 - 34.0 pg   MCHC 32.9 30.0 - 36.0 g/dL   RDW 13.2 11.5 - 15.5 %   Platelets 166 150 - 400 K/uL  Basic metabolic panel     Status: Abnormal   Collection Time: 01/25/15  4:07 AM  Result Value Ref Range   Sodium 138 135 - 145 mmol/L   Potassium 4.2 3.5 - 5.1 mmol/L   Chloride 104 101 - 111 mmol/L   CO2 28 22 - 32 mmol/L   Glucose, Bld 102 (H) 65 - 99  mg/dL   BUN 8 6 - 20 mg/dL   Creatinine, Ser 0.96 0.61 - 1.24 mg/dL   Calcium 9.1 8.9 - 10.3 mg/dL   GFR calc non Af Amer >60 >60 mL/min   GFR calc Af Amer >60 >60 mL/min    Comment: (NOTE) The eGFR has been calculated using the CKD EPI equation. This calculation has not been validated in all clinical situations. eGFR's persistently <60 mL/min signify possible Chronic Kidney Disease.    Anion gap 6 5 - 15   Recent Results (from the past 240 hour(s))  Urine culture     Status: None (Preliminary result)   Collection Time: 01/22/15  8:58 PM  Result Value Ref Range Status   Specimen Description URINE, CLEAN CATCH  Final   Special Requests NONE  Final   Culture >=100,000 COLONIES/mL STAPHYLOCOCCUS  AUREUS  Final   Report Status PENDING  Incomplete  Blood culture (routine x 2)     Status: None   Collection Time: 01/22/15  9:10 PM  Result Value Ref Range Status   Specimen Description BLOOD LEFT FOREARM  Final   Special Requests BOTTLES DRAWN AEROBIC AND ANAEROBIC 10CC  Final   Culture  Setup Time   Final    GRAM POSITIVE COCCI IN PAIRS IN BOTH AEROBIC AND ANAEROBIC BOTTLES CRITICAL RESULT CALLED TO, READ BACK BY AND VERIFIED WITH: A [facility] AT 0160 01/23/15 BY L BENFIELD    Culture STAPHYLOCOCCUS AUREUS  Final   Report Status 01/25/2015 FINAL  Final   Organism ID, Bacteria STAPHYLOCOCCUS AUREUS  Final      Susceptibility   Staphylococcus aureus - MIC*    CIPROFLOXACIN <=0.5 SENSITIVE Sensitive     ERYTHROMYCIN <=0.25 SENSITIVE Sensitive     GENTAMICIN <=0.5 SENSITIVE Sensitive     OXACILLIN 0.5 SENSITIVE Sensitive     TETRACYCLINE <=1 SENSITIVE Sensitive     VANCOMYCIN 1 SENSITIVE Sensitive     TRIMETH/SULFA <=10 SENSITIVE Sensitive     CLINDAMYCIN <=0.25 SENSITIVE Sensitive     RIFAMPIN <=0.5 SENSITIVE Sensitive     Inducible Clindamycin NEGATIVE Sensitive     * STAPHYLOCOCCUS AUREUS  Blood culture (routine x 2)     Status: None (Preliminary result)   Collection Time:  01/22/15  9:17 PM  Result Value Ref Range Status   Specimen Description BLOOD RIGHT ARM  Final   Special Requests BOTTLES DRAWN AEROBIC AND ANAEROBIC 5CC  Final   Culture NO GROWTH 2 DAYS  Final   Report Status PENDING  Incomplete   Creatinine:  Recent Labs  01/22/15 2056 01/22/15 2106 01/23/15 0356 01/24/15 0658 01/25/15 0407  CREATININE 0.90  0.90 0.98 1.13 0.96 0.96   Imaging: I personally reviewed these images EXAM: RENAL / URINARY TRACT ULTRASOUND COMPLETE  COMPARISON: None.  FINDINGS: First right pelvic transplant kidney:  Length: 10.4 cm. Echogenicity within normal limits. No mass or hydronephrosis visualized. Normal internal blood flow with color Doppler. No peritransplant fluid.  Second right pelvic transplant kidney:  Length: 11.7 cm. Echogenicity within normal limits. No mass or hydronephrosis visualized. Normal internal blood flow with color Doppler. No peritransplant fluid.  Bladder:  Rounded, heterogeneous, predominantly echogenic mass adherent to the bladder wall on the right, measuring 2.0 x 1.8 x 1.5 cm. No internal blood flow was seen within this mass with color Doppler.  The native kidneys are surgically absent.  IMPRESSION: 1. Two right pelvic transplant kidneys without hydronephrosis or peritransplant fluid. 2. 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 This could be related to the patient's previous surgery or represent an adherent, developing bladder calculus. Correlation with any previous available examinations would be helpful.   Impression/Assessment:  24 yo M with complex urologic history as above currently admitted with sepsis of urinary origin with Staph Aureus. RUS performed today demonstrates 2 cm echogenic mass in bladder with calcifications and no internal blood flow which is likely due to one of his prior surgeries, however, unable to  confirm this without cystoscopy.  Plan:  1. Plan to perform bedside cystourethroscopy tomorrow morning 2. Please order cystoscopy cart to be at patients room by 8:00 am tomorrow 3. No need to be NPO for procedure   I have interviewed the patient with Dr. Frederico Hamman, and agree with the above procedure. We will consider noncontrasted  CT scan if necessary.   Acie Fredrickson 01/25/2015, 10:55 AM

## 2015-01-25 NOTE — Progress Notes (Signed)
INFECTIOUS DISEASE PROGRESS NOTE  ID: Larry Mcgee is a 24 y.o. male with  Principal Problem:   Sepsis (HCC) Active Problems:   UTI (lower urinary tract infection)   Acute pyelonephritis   Essential hypertension   History of renal transplant  Subjective: Without complaints Denies dysuria  Abtx:  Anti-infectives    Start     Dose/Rate Route Frequency Ordered Stop   01/24/15 2000  vancomycin (VANCOCIN) 1,250 mg in sodium chloride 0.9 % 250 mL IVPB     1,250 mg 166.7 mL/hr over 90 Minutes Intravenous Every 8 hours 01/24/15 1914     01/23/15 1900  vancomycin (VANCOCIN) IVPB 1000 mg/200 mL premix  Status:  Discontinued     1,000 mg 200 mL/hr over 60 Minutes Intravenous Every 8 hours 01/23/15 1730 01/24/15 1914   01/23/15 1000  acyclovir (ZOVIRAX) 200 MG capsule 200 mg     200 mg Oral 2 times daily 01/23/15 0251     01/23/15 0400  meropenem (MERREM) 1 g in sodium chloride 0.9 % 100 mL IVPB  Status:  Discontinued    Comments:  Meropenem 1 g IV q8h for CrCl > 50 mL/min   1 g 200 mL/hr over 30 Minutes Intravenous 3 times per day 01/23/15 0257 01/24/15 1417   01/23/15 0100  levofloxacin (LEVAQUIN) IVPB 500 mg  Status:  Discontinued     500 mg 100 mL/hr over 60 Minutes Intravenous Daily at bedtime 01/23/15 0021 01/23/15 0251      Medications:  Scheduled: . acyclovir  200 mg Oral BID  . aspirin  81 mg Oral Daily  . enoxaparin (LOVENOX) injection  40 mg Subcutaneous Q24H  . lisinopril  5 mg Oral QHS  . mycophenolate  750 mg Oral BID  . predniSONE  5 mg Oral Q breakfast  . tacrolimus  4 mg Oral BID  . vancomycin  1,250 mg Intravenous Q8H    Objective: Vital signs in last 24 hours: Temp:  [97.7 F (36.5 C)-98.6 F (37 C)] 98.6 F (37 C) (12/03 0945) Pulse Rate:  [74-89] 89 (12/03 0945) Resp:  [14-20] 16 (12/03 0945) BP: (102-110)/(52-59) 109/56 mmHg (12/03 0945) SpO2:  [97 %-98 %] 98 % (12/03 0945) Weight:  [86.41 kg (190 lb 8 oz)] 86.41 kg (190 lb 8 oz) (12/02  2300)   General appearance: alert, cooperative and no distress GI: normal findings: bowel sounds normal and soft, non-tender  Lab Results  Recent Labs  01/24/15 0658 01/25/15 0407  WBC 12.6* 11.2*  HGB 14.7 14.1  HCT 44.0 42.9  NA 137 138  K 3.7 4.2  CL 100* 104  CO2 26 28  BUN <5* 8  CREATININE 0.96 0.96   Liver Panel  Recent Labs  01/22/15 2106 01/23/15 0356  PROT 7.5 5.9*  ALBUMIN 4.1 3.1*  AST 66* 45*  ALT 67* 47  ALKPHOS 75 58  BILITOT 1.0 1.0   Sedimentation Rate No results for input(s): ESRSEDRATE in the last 72 hours. C-Reactive Protein No results for input(s): CRP in the last 72 hours.  Microbiology: Recent Results (from the past 240 hour(s))  Urine culture     Status: None (Preliminary result)   Collection Time: 01/22/15  8:58 PM  Result Value Ref Range Status   Specimen Description URINE, CLEAN CATCH  Final   Special Requests NONE  Final   Culture >=100,000 COLONIES/mL STAPHYLOCOCCUS AUREUS  Final   Report Status PENDING  Incomplete  Blood culture (routine x 2)     Status:  None   Collection Time: 01/22/15  9:10 PM  Result Value Ref Range Status   Specimen Description BLOOD LEFT FOREARM  Final   Special Requests BOTTLES DRAWN AEROBIC AND ANAEROBIC 10CC  Final   Culture  Setup Time   Final    GRAM POSITIVE COCCI IN PAIRS IN BOTH AEROBIC AND ANAEROBIC BOTTLES CRITICAL RESULT CALLED TO, READ BACK BY AND VERIFIED WITH: A [facility] AT 1533 01/23/15 BY L BENFIELD    Culture STAPHYLOCOCCUS AUREUS  Final   Report Status 01/25/2015 FINAL  Final   Organism ID, Bacteria STAPHYLOCOCCUS AUREUS  Final      Susceptibility   Staphylococcus aureus - MIC*    CIPROFLOXACIN <=0.5 SENSITIVE Sensitive     ERYTHROMYCIN <=0.25 SENSITIVE Sensitive     GENTAMICIN <=0.5 SENSITIVE Sensitive     OXACILLIN 0.5 SENSITIVE Sensitive     TETRACYCLINE <=1 SENSITIVE Sensitive     VANCOMYCIN 1 SENSITIVE Sensitive     TRIMETH/SULFA <=10 SENSITIVE Sensitive     CLINDAMYCIN  <=0.25 SENSITIVE Sensitive     RIFAMPIN <=0.5 SENSITIVE Sensitive     Inducible Clindamycin NEGATIVE Sensitive     * STAPHYLOCOCCUS AUREUS  Blood culture (routine x 2)     Status: None (Preliminary result)   Collection Time: 01/22/15  9:17 PM  Result Value Ref Range Status   Specimen Description BLOOD RIGHT ARM  Final   Special Requests BOTTLES DRAWN AEROBIC AND ANAEROBIC 5CC  Final   Culture NO GROWTH 2 DAYS  Final   Report Status PENDING  Incomplete    Studies/Results: US Renal  01/24/2015  CLINICAL DATA:  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 EXAM: RENAL / URINARY TRACT ULTRASOUND COMPLETE COMPARISON:  None. FINDINGS: First right pelvic transplant kidney: Length: 10.4 cm. Echogenicity within normal limits. No mass or hydronephrosis visualized. Normal internal blood flow with color Doppler. No peritransplant fluid. Second right pelvic transplant kidney: Length: 11.7 cm. Echogenicity within normal limits. No mass or hydronephrosis visualized. Normal internal blood flow with color Doppler. No peritransplant fluid. Bladder: Rounded, heterogeneous, predominantly echogenic mass adherent to the bladder wall on the right, measuring 2.0 x 1.8 x 1.5 cm. No internal blood flow was seen within this mass with color Doppler. The native kidneys are surgically absent. IMPRESSION: 1. Two right pelvic transplant kidneys without hydronephrosis or peritransplant fluid. 2. 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 This could be related to the patient's previous surgery or represent an adherent, developing bladder calculus. Correlation with any previous available examinations would be helpful. Electronically Signed   By: Beckie Salts M.D.   On: 01/24/2015 20:56     Assessment/Plan: Staph aureus bacteremia (MSSA), UTI Will change to ancef Check TTE Repeat BCx are ngtd He denies presence of foreign bodies.    Renal Txp Check renal u/s shows ? Mass in bladder For cystoscopy in AM Continue immunosuppressants  Total days of antibiotics: 2 vanco --> ancef         Johny Sax Infectious Diseases (pager) [phone] [email or web address] 01/25/2015, 12:16 PM  LOS: 2 days

## 2015-01-26 DIAGNOSIS — R7881 Bacteremia: Secondary | ICD-10-CM | POA: Insufficient documentation

## 2015-01-26 DIAGNOSIS — A4101 Sepsis due to Methicillin susceptible Staphylococcus aureus: Principal | ICD-10-CM

## 2015-01-26 DIAGNOSIS — B9561 Methicillin susceptible Staphylococcus aureus infection as the cause of diseases classified elsewhere: Secondary | ICD-10-CM | POA: Insufficient documentation

## 2015-01-26 NOTE — Progress Notes (Signed)
INFECTIOUS DISEASE PROGRESS NOTE  ID: Larry Mcgee is a 24 y.o. male with  Principal Problem:   Sepsis ([place]) Active Problems:   UTI (lower urinary tract infection)   Acute pyelonephritis   Essential hypertension   History of renal transplant  Subjective: Without complaints  Abtx:  Anti-infectives    Start     Dose/Rate Route Frequency Ordered Stop   01/25/15 1400  ceFAZolin (ANCEF) IVPB 1 g/50 mL premix     1 g 100 mL/hr over 30 Minutes Intravenous 3 times per day 01/25/15 1219     01/24/15 2000  vancomycin (VANCOCIN) 1,250 mg in sodium chloride 0.9 % 250 mL IVPB  Status:  Discontinued     1,250 mg 166.7 mL/hr over 90 Minutes Intravenous Every 8 hours 01/24/15 1914 01/25/15 1219   01/23/15 1900  vancomycin (VANCOCIN) IVPB 1000 mg/200 mL premix  Status:  Discontinued     1,000 mg 200 mL/hr over 60 Minutes Intravenous Every 8 hours 01/23/15 1730 01/24/15 1914   01/23/15 1000  acyclovir (ZOVIRAX) 200 MG capsule 200 mg     200 mg Oral 2 times daily 01/23/15 0251     01/23/15 0400  meropenem (MERREM) 1 g in sodium chloride 0.9 % 100 mL IVPB  Status:  Discontinued    Comments:  Meropenem 1 g IV q8h for CrCl > 50 mL/min   1 g 200 mL/hr over 30 Minutes Intravenous 3 times per day 01/23/15 0257 01/24/15 1417   01/23/15 0100  levofloxacin (LEVAQUIN) IVPB 500 mg  Status:  Discontinued     500 mg 100 mL/hr over 60 Minutes Intravenous Daily at bedtime 01/23/15 0021 01/23/15 0251      Medications:  Scheduled: . acyclovir  200 mg Oral BID  . aspirin  81 mg Oral Daily  .  ceFAZolin (ANCEF) IV  1 g Intravenous 3 times per day  . enoxaparin (LOVENOX) injection  40 mg Subcutaneous Q24H  . lisinopril  5 mg Oral QHS  . mycophenolate  750 mg Oral BID  . predniSONE  5 mg Oral Q breakfast  . tacrolimus  4 mg Oral BID    Objective: Vital signs in last 24 hours: Temp:  [97.6 F (36.4 C)-98.7 F (37.1 C)] 97.7 F (36.5 C) (12/04 0836) Pulse Rate:  [61-76] 61 (12/04 0836) Resp:   [16-18] 18 (12/04 0836) BP: (96-109)/(44-78) 96/44 mmHg (12/04 0836) SpO2:  [96 %-99 %] 98 % (12/04 0836) Weight:  [89.858 kg (198 lb 1.6 oz)] 89.858 kg (198 lb 1.6 oz) (12/04 0454)   General appearance: alert, cooperative and no distress  Lab Results  Recent Labs  01/24/15 0658 01/25/15 0407  WBC 12.6* 11.2*  HGB 14.7 14.1  HCT 44.0 42.9  NA 137 138  K 3.7 4.2  CL 100* 104  CO2 26 28  BUN <5* 8  CREATININE 0.96 0.96   Liver Panel No results for input(s): PROT, ALBUMIN, AST, ALT, ALKPHOS, BILITOT, BILIDIR, IBILI in the last 72 hours. Sedimentation Rate No results for input(s): ESRSEDRATE in the last 72 hours. C-Reactive Protein No results for input(s): CRP in the last 72 hours.  Microbiology: Recent Results (from the past 240 hour(s))  Urine culture     Status: None   Collection Time: 01/22/15  8:58 PM  Result Value Ref Range Status   Specimen Description URINE, CLEAN CATCH  Final   Special Requests NONE  Final   Culture >=100,000 COLONIES/mL STAPHYLOCOCCUS AUREUS  Final   Report Status 01/25/2015 FINAL  Final   Organism ID, Bacteria STAPHYLOCOCCUS AUREUS  Final      Susceptibility   Staphylococcus aureus - MIC*    CIPROFLOXACIN <=0.5 SENSITIVE Sensitive     GENTAMICIN <=0.5 SENSITIVE Sensitive     NITROFURANTOIN <=16 SENSITIVE Sensitive     OXACILLIN 0.5 SENSITIVE Sensitive     TETRACYCLINE <=1 SENSITIVE Sensitive     VANCOMYCIN <=0.5 SENSITIVE Sensitive     TRIMETH/SULFA <=10 SENSITIVE Sensitive     CLINDAMYCIN <=0.25 SENSITIVE Sensitive     RIFAMPIN <=0.5 SENSITIVE Sensitive     Inducible Clindamycin NEGATIVE Sensitive     * >=100,000 COLONIES/mL STAPHYLOCOCCUS AUREUS  Blood culture (routine x 2)     Status: None   Collection Time: 01/22/15  9:10 PM  Result Value Ref Range Status   Specimen Description BLOOD LEFT FOREARM  Final   Special Requests BOTTLES DRAWN AEROBIC AND ANAEROBIC 10CC  Final   Culture  Setup Time   Final    GRAM POSITIVE COCCI IN  PAIRS IN BOTH AEROBIC AND ANAEROBIC BOTTLES CRITICAL RESULT CALLED TO, READ BACK BY AND VERIFIED WITH: A [facility] AT 6314 01/23/15 BY L BENFIELD    Culture STAPHYLOCOCCUS AUREUS  Final   Report Status 01/25/2015 FINAL  Final   Organism ID, Bacteria STAPHYLOCOCCUS AUREUS  Final      Susceptibility   Staphylococcus aureus - MIC*    CIPROFLOXACIN <=0.5 SENSITIVE Sensitive     ERYTHROMYCIN <=0.25 SENSITIVE Sensitive     GENTAMICIN <=0.5 SENSITIVE Sensitive     OXACILLIN 0.5 SENSITIVE Sensitive     TETRACYCLINE <=1 SENSITIVE Sensitive     VANCOMYCIN 1 SENSITIVE Sensitive     TRIMETH/SULFA <=10 SENSITIVE Sensitive     CLINDAMYCIN <=0.25 SENSITIVE Sensitive     RIFAMPIN <=0.5 SENSITIVE Sensitive     Inducible Clindamycin NEGATIVE Sensitive     * STAPHYLOCOCCUS AUREUS  Blood culture (routine x 2)     Status: None (Preliminary result)   Collection Time: 01/22/15  9:17 PM  Result Value Ref Range Status   Specimen Description BLOOD RIGHT ARM  Final   Special Requests BOTTLES DRAWN AEROBIC AND ANAEROBIC 5CC  Final   Culture NO GROWTH 4 DAYS  Final   Report Status PENDING  Incomplete  Culture, blood (routine x 2)     Status: None (Preliminary result)   Collection Time: 01/24/15  6:31 PM  Result Value Ref Range Status   Specimen Description BLOOD RIGHT ARM  Final   Special Requests BOTTLES DRAWN AEROBIC AND ANAEROBIC 10CC  Final   Culture NO GROWTH 2 DAYS  Final   Report Status PENDING  Incomplete    Studies/Results: US Renal  01/24/2015  CLINICAL DATA:  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 EXAM: RENAL / URINARY TRACT ULTRASOUND COMPLETE COMPARISON:  None. FINDINGS: First right pelvic transplant kidney: Length: 10.4 cm. Echogenicity within normal limits. No mass or hydronephrosis visualized. Normal internal blood flow with color Doppler. No peritransplant fluid. Second right pelvic transplant kidney: Length: 11.7 cm. Echogenicity within normal limits. No mass  or hydronephrosis visualized. Normal internal blood flow with color Doppler. No peritransplant fluid. Bladder: Rounded, heterogeneous, predominantly echogenic mass adherent to the bladder wall on the right, measuring 2.0 x 1.8 x 1.5 cm. No internal blood flow was seen within this mass with color Doppler. The native kidneys are surgically absent. IMPRESSION: 1. Two right pelvic transplant kidneys without hydronephrosis or peritransplant fluid. 2. 2.0 cm rounded, predominantly echogenic mass in the  urinary bladder on the right, without visible internal blood flow. This could be related to the patient's previous surgery or represent an adherent, developing bladder calculus. Correlation with any previous available examinations would be helpful. Electronically Signed   By: Claudie Revering M.D.   On: 01/24/2015 20:56     Assessment/Plan: Staph aureus bacteremia (MSSA), UTI continue ancef Plan for a total of 2 weeks (needs 11 more days) Repeat bcx 1 week after completing anbx TTE (-) Repeat BCx are ngtd He denies presence of foreign bodies.  Place mid-line Can f/u in ID clinic in 7-14 days  Renal Txp No unexpected lesions on cysto Continue immunosuppressants  Total days of antibiotics: 3 vanco --> ancef         Bobby Rumpf Infectious Diseases (pager) [phone] [email or web address] 01/26/2015, 4:37 PM  LOS: 3 days    Diagnosis: MSSA bacteremia, UTI  Culture Result: MSSA  Allergies  Allergen Reactions  . Ampicillin Itching and Rash  . Codeine Itching and Rash  . Latex Itching and Rash    Discharge antibiotics: ancef 1 g tid ivpb Per pharmacy protocol  Duration: 14 days total  End Date: 12-14  Midline Care Per Protocol: Labs weekly while on IV antibiotics: __ CBC with differential __ CMP __ CRP __ ESR __ Vancomycin trough  Fax weekly labs to [phone]  Clinic Follow Up Appt: 7-10 days

## 2015-01-26 NOTE — Procedures (Signed)
Foley Catheter Placement Note  Indications:  1. Bladder mass  Pre-operative Diagnosis:  1. Bladder mass  Post-operative Diagnosis:  2. 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 Emily FilbertSophie Spencer, MD  Assistants: None  Procedure Details  Patient was placed in the supine position, prepped with Betadine and draped in the usual sterile fashion.  We injected lidocaine jelly per urethra prior to the procedure.    We then proceeded with cystourethroscopy. The patient was positioned supine on the table in the relaxed dorsal lithotomy  position and the external genitalia were prepped and draped in the standard fashion.  The cystoscope was inserted per urethra with copious lubrication.  The anterior and posterior urethra was normal.  Entry into the bladder demonstrated a healthy appearing bladder with no evidence of inflammation or friability. Evidence of prior ureteral augment on the dome of the bladder. On the right posterior dome of the bladder the distal end of the appendicovesicostomy was present.  This was round and very protuberant and healthy appearing with an opening in the middle consistent with the catheterizable channel and a small wisp of mucus extending from the mouth of the catheterizable channel consistent with the use of his appendix for formation of the appendicovesicostomy. This was in the expected location of the bladder mass which was seen on ultrasound. No other bladder masses, stones, fungal balls or other foreign bodies or mucosal abnormalities was noted.   Complications: None; patient tolerated the procedure well.  I have seen the patient and performed the procedure with Dr. Karleen HampshireSpencer and agree with the above

## 2015-01-26 NOTE — Progress Notes (Signed)
PATIENT DETAILS Name: Larry Mcgee Age: 24 y.o. Sex: male Date of Birth: 1990/08/05 Admit Date: 01/22/2015 Admitting Physician Eduard Clos, MD PCP:No primary care provider on file.  Subjective: Anxious to go home-afebrile and much improved  Assessment/Plan: Principal Problem: Sepsis secondary to MSSA bacteremia: Initially thought to be secondary to pyelonephritis/gram-negative organisms-but blood and urine cultures positive for MSSA. Antibiotics now narrowed to Ancef. 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 Overall much improved-leukocytosis resolved, no fever this morning.Await ID input regarding timing of PICC placement and duration of IV Ancef.   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 Sherron Monday). Renal ultrasound showed a 2 cm bladder mass-Urology performed a cystoscopy on 12/4-mass was the distal end of his appendicovesicostomy. No further work up required, Urology recommends to keep appointment with Dr. Sherron Monday at [facility]  Disposition: Remain inpatient-home when workup complete  Antimicrobial agents  See below  Anti-infectives    Start     Dose/Rate Route Frequency Ordered Stop   01/25/15 1400  ceFAZolin (ANCEF) IVPB 1 g/50 mL premix     1 g 100 mL/hr over 30 Minutes Intravenous 3 times per day 01/25/15 1219     01/24/15 2000  vancomycin  (VANCOCIN) 1,250 mg in sodium chloride 0.9 % 250 mL IVPB  Status:  Discontinued     1,250 mg 166.7 mL/hr over 90 Minutes Intravenous Every 8 hours 01/24/15 1914 01/25/15 1219   01/23/15 1900  vancomycin (VANCOCIN) IVPB 1000 mg/200 mL premix  Status:  Discontinued     1,000 mg 200 mL/hr over 60 Minutes Intravenous Every 8 hours 01/23/15 1730 01/24/15 1914   01/23/15 1000  acyclovir (ZOVIRAX) 200 MG capsule 200 mg     200 mg Oral 2 times daily 01/23/15 0251     01/23/15 0400  meropenem (MERREM) 1 g in sodium chloride 0.9 % 100 mL IVPB  Status:  Discontinued    Comments:  Meropenem 1 g IV q8h for CrCl > 50 mL/min   1 g 200 mL/hr over 30 Minutes Intravenous 3 times per day 01/23/15 0257 01/24/15 1417   01/23/15 0100  levofloxacin (LEVAQUIN) IVPB 500 mg  Status:  Discontinued     500 mg 100 mL/hr over 60 Minutes Intravenous Daily at bedtime 01/23/15 0021 01/23/15 0251      DVT Prophylaxis: Prophylactic Lovenox   Code Status: Full code   Family Communication None at bedside  Procedures: None  CONSULTS:  None  Time spent 20 minutes-Greater than 50% of this time was spent in counseling, explanation of diagnosis, planning of further management, and coordination of care  MEDICATIONS: Scheduled Meds: . acyclovir  200 mg Oral BID  . aspirin  81 mg Oral Daily  .  ceFAZolin (ANCEF) IV  1 g Intravenous 3 times per day  . enoxaparin (LOVENOX) injection  40 mg Subcutaneous Q24H  . lisinopril  5 mg Oral QHS  . mycophenolate  750 mg Oral BID  . predniSONE  5 mg  Oral Q breakfast  . tacrolimus  4 mg Oral BID   Continuous Infusions:   PRN Meds:.acetaminophen **OR** acetaminophen, ondansetron **OR** ondansetron (ZOFRAN) IV    PHYSICAL EXAM: Vital signs in last 24 hours: Filed Vitals:   01/25/15 2110 01/26/15 0454 01/26/15 0511 01/26/15 0836  BP: 106/78  104/65 96/44  Pulse: 76  70 61  Temp: 98.2 F (36.8 C)  97.6 F (36.4 C) 97.7 F (36.5 C)  TempSrc: Oral  Oral Oral  Resp:  Height:      Weight: 89.858 kg (198 lb 1.6 oz) 89.858 kg (198 lb 1.6 oz)    SpO2: 96%  99% 98%    Weight change: 3.447 kg (7 lb 9.6 oz) Filed Weights   01/24/15 2300 01/25/15 2110 01/26/15 0454  Weight: 86.41 kg (190 lb 8 oz) 89.858 kg (198 lb 1.6 oz) 89.858 kg (198 lb 1.6 oz)   Body mass index is 29.24 kg/(m^2).   Gen Exam: Awake and alert with clear speech.   Neck: Supple, No JVD.   Chest: B/L Clear.   CVS: S1 S2 Regular, no murmurs.  Abdomen: soft, BS +, non tender, non distended. Tender Right flank Extremities: no edema, lower extremities warm to touch. Neurologic: Non Focal.   Skin: No Rash.   Wounds: N/A.    Intake/Output from previous day:  Intake/Output Summary (Last 24 hours) at 01/26/15 1338 Last data filed at 01/26/15 1125  Gross per 24 hour  Intake    480 ml  Output   1000 ml  Net   -520 ml     LAB RESULTS: CBC  Recent Labs Lab 01/22/15 2056 01/22/15 2106 01/23/15 0356 01/24/15 0658 01/25/15 0407  WBC  --  17.0* 14.9* 12.6* 11.2*  HGB 17.3*  17.3* 15.5 14.1 14.7 14.1  HCT 51.0  51.0 46.4 41.8 44.0 42.9  PLT  --  199 179 156 166  MCV  --  86.1 86.9 87.0 87.6  MCH  --  28.8 29.3 29.1 28.8  MCHC  --  33.4 33.7 33.4 32.9  RDW  --  13.0 13.0 13.4 13.2  LYMPHSABS  --   --  1.2  --   --   MONOABS  --   --  2.4*  --   --   EOSABS  --   --  0.0  --   --   BASOSABS  --   --  0.0  --   --     Chemistries   Recent Labs Lab 01/22/15 2056 01/22/15 2106 01/23/15 0356 01/24/15 0658 01/25/15 0407  NA 137  137 135 137 137 138  K 3.6  3.6 3.6 3.5 3.7 4.2  CL 98*  98* 101 105 100* 104  CO2  --  GLUCOSE 110*  110* 113* 124* 98 102*  BUN <5* 8  CREATININE 0.90  0.90 0.98 1.13 0.96 0.96  CALCIUM  --  9.7 8.5* 9.3 9.1    CBG: No results for input(s): GLUCAP in the last 168 hours.  GFR Estimated Creatinine Clearance: 133.8 mL/min (by C-G formula based on Cr of 0.96).  Coagulation profile No results for  input(s): INR, PROTIME in the last 168 hours.  Cardiac Enzymes No results for input(s): CKMB, TROPONINI, MYOGLOBIN in the last 168 hours.  Invalid input(s): CK  Invalid input(s): POCBNP No results for input(s): DDIMER in the last 72 hours. No results for input(s): HGBA1C in  the last 72 hours. No results for input(s): CHOL, HDL, LDLCALC, TRIG, CHOLHDL, LDLDIRECT in the last 72 hours. No results for input(s): TSH, T4TOTAL, T3FREE, THYROIDAB in the last 72 hours.  Invalid input(s): FREET3 No results for input(s): VITAMINB12, FOLATE, FERRITIN, TIBC, IRON, RETICCTPCT in the last 72 hours. No results for input(s): LIPASE, AMYLASE in the last 72 hours.  Urine Studies No results for input(s): UHGB, CRYS in the last 72 hours.  Invalid input(s): UACOL, UAPR, USPG, UPH, UTP, UGL, UKET, UBIL, UNIT, UROB, ULEU, UEPI, UWBC, URBC, UBAC, CAST, UCOM, BILUA  MICROBIOLOGY: Recent Results (from the past 240 hour(s))  Urine culture     Status: None   Collection Time: 01/22/15  8:58 PM  Result Value Ref Range Status   Specimen Description URINE, CLEAN CATCH  Final   Special Requests NONE  Final   Culture >=100,000 COLONIES/mL STAPHYLOCOCCUS AUREUS  Final   Report Status 01/25/2015 FINAL  Final   Organism ID, Bacteria STAPHYLOCOCCUS AUREUS  Final      Susceptibility   Staphylococcus aureus - MIC*    CIPROFLOXACIN <=0.5 SENSITIVE Sensitive     GENTAMICIN <=0.5 SENSITIVE Sensitive     NITROFURANTOIN <=16 SENSITIVE Sensitive     OXACILLIN 0.5 SENSITIVE Sensitive     TETRACYCLINE <=1 SENSITIVE Sensitive     VANCOMYCIN <=0.5 SENSITIVE Sensitive     TRIMETH/SULFA <=10 SENSITIVE Sensitive     CLINDAMYCIN <=0.25 SENSITIVE Sensitive     RIFAMPIN <=0.5 SENSITIVE Sensitive     Inducible Clindamycin NEGATIVE Sensitive     * >=100,000 COLONIES/mL STAPHYLOCOCCUS AUREUS  Blood culture (routine x 2)     Status: None   Collection Time: 01/22/15  9:10 PM  Result Value Ref Range Status   Specimen Description  BLOOD LEFT FOREARM  Final   Special Requests BOTTLES DRAWN AEROBIC AND ANAEROBIC 10CC  Final   Culture  Setup Time   Final    GRAM POSITIVE COCCI IN PAIRS IN BOTH AEROBIC AND ANAEROBIC BOTTLES CRITICAL RESULT CALLED TO, READ BACK BY AND VERIFIED WITH: A [facility] AT 1533 01/23/15 BY L BENFIELD    Culture STAPHYLOCOCCUS AUREUS  Final   Report Status 01/25/2015 FINAL  Final   Organism ID, Bacteria STAPHYLOCOCCUS AUREUS  Final      Susceptibility   Staphylococcus aureus - MIC*    CIPROFLOXACIN <=0.5 SENSITIVE Sensitive     ERYTHROMYCIN <=0.25 SENSITIVE Sensitive     GENTAMICIN <=0.5 SENSITIVE Sensitive     OXACILLIN 0.5 SENSITIVE Sensitive     TETRACYCLINE <=1 SENSITIVE Sensitive     VANCOMYCIN 1 SENSITIVE Sensitive     TRIMETH/SULFA <=10 SENSITIVE Sensitive     CLINDAMYCIN <=0.25 SENSITIVE Sensitive     RIFAMPIN <=0.5 SENSITIVE Sensitive     Inducible Clindamycin NEGATIVE Sensitive     * STAPHYLOCOCCUS AUREUS  Blood culture (routine x 2)     Status: None (Preliminary result)   Collection Time: 01/22/15  9:17 PM  Result Value Ref Range Status   Specimen Description BLOOD RIGHT ARM  Final   Special Requests BOTTLES DRAWN AEROBIC AND ANAEROBIC 5CC  Final   Culture NO GROWTH 3 DAYS  Final   Report Status PENDING  Incomplete  Culture, blood (routine x 2)     Status: None (Preliminary result)   Collection Time: 01/24/15  6:31 PM  Result Value Ref Range Status   Specimen Description BLOOD RIGHT ARM  Final   Special Requests BOTTLES DRAWN AEROBIC AND ANAEROBIC 10CC  Final   Culture  NO GROWTH < 24 HOURS  Final   Report Status PENDING  Incomplete    RADIOLOGY STUDIES/RESULTS: US Renal  01/24/2015  CLINICAL DATA:  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 EXAM: RENAL / URINARY TRACT ULTRASOUND COMPLETE COMPARISON:  None. FINDINGS: First right pelvic transplant kidney: Length: 10.4 cm. Echogenicity within normal limits. No mass or hydronephrosis visualized.  Normal internal blood flow with color Doppler. No peritransplant fluid. Second right pelvic transplant kidney: Length: 11.7 cm. Echogenicity within normal limits. No mass or hydronephrosis visualized. Normal internal blood flow with color Doppler. No peritransplant fluid. Bladder: Rounded, heterogeneous, predominantly echogenic mass adherent to the bladder wall on the right, measuring 2.0 x 1.8 x 1.5 cm. No internal blood flow was seen within this mass with color Doppler. The native kidneys are surgically absent. IMPRESSION: 1. Two right pelvic transplant kidneys without hydronephrosis or peritransplant fluid. 2. 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 This could be related to the patient's previous surgery or represent an adherent, developing bladder calculus. Correlation with any previous available examinations would be helpful. Electronically Signed   By: Beckie Salts M.D.   On: 01/24/2015 20:56   Dg Chest Port 1 View  01/22/2015  CLINICAL DATA:  Right lower quadrant pain, cough, fever. Renal transplant. EXAM: PORTABLE CHEST 1 VIEW COMPARISON:  None. FINDINGS: A single AP portable view of the chest demonstrates no focal airspace consolidation or alveolar edema. The lungs are grossly clear. There is no large effusion or pneumothorax. Cardiac and mediastinal contours appear unremarkable. IMPRESSION: No active disease. Electronically Signed   By: Ellery Plunk M.D.   On: 01/22/2015 21:40    Jeoffrey Massed, MD  [facility] Pager:[phone]  If 7PM-7AM, please contact night-coverage [email or web address] Password [email or web address] 01/26/2015, 1:38 PM   LOS: 3 days

## 2015-01-26 NOTE — Progress Notes (Signed)
Subjective: Patient reports that he continues to feel well and feels better than usual. No new complaints. Catheterizing at his baseline.   Objective: Vital signs in last 24 hours: Temp:  [97.6 F (36.4 C)-98.7 F (37.1 C)] 97.7 F (36.5 C) (12/04 0836) Pulse Rate:  [61-76] 61 (12/04 0836) Resp:  [16-18] 18 (12/04 0836) BP: (96-109)/(44-78) 96/44 mmHg (12/04 0836) SpO2:  [96 %-99 %] 98 % (12/04 0836) Weight:  [89.858 kg (198 lb 1.6 oz)] 89.858 kg (198 lb 1.6 oz) (12/04 0454)  Intake/Output from previous day: 12/03 0701 - 12/04 0700 In: 720 [P.O.:720] Out: 1000 [Urine:1000] Intake/Output this shift:    Physical Exam:  General:alert, cooperative and appears stated age GI: soft, non tender, normal bowel sounds, no palpable masses, no organomegaly, no inguinal hernia Male genitalia: no penile lesions or discharge no testicular masses no bladder distension noted Penis: normal, no lesions and circumcised Extremities: extremities normal, atraumatic, no cyanosis or edema  Lab Results:  Recent Labs  01/24/15 0658 01/25/15 0407  HGB 14.7 14.1  HCT 44.0 42.9   BMET  Recent Labs  01/24/15 0658 01/25/15 0407  NA 137 138  K 3.7 4.2  CL 100* 104  CO2 26 28  GLUCOSE 98 102*  BUN <5* 8  CREATININE 0.96 0.96  CALCIUM 9.3 9.1   No results for input(s): LABPT, INR in the last 72 hours. No results for input(s): LABURIN in the last 72 hours. Results for orders placed or performed during the hospital encounter of 01/22/15  Blood culture (routine x 2)     Status: None   Collection Time: 01/22/15  9:10 PM  Result Value Ref Range Status   Specimen Description BLOOD LEFT FOREARM  Final   Special Requests BOTTLES DRAWN AEROBIC AND ANAEROBIC 10CC  Final   Culture  Setup Time   Final    GRAM POSITIVE COCCI IN PAIRS IN BOTH AEROBIC AND ANAEROBIC BOTTLES CRITICAL RESULT CALLED TO, READ BACK BY AND VERIFIED WITH: [facility] AT 1533 01/23/15 BY L BENFIELD    Culture  STAPHYLOCOCCUS AUREUS  Final   Report Status 01/25/2015 FINAL  Final   Organism ID, Bacteria STAPHYLOCOCCUS AUREUS  Final      Susceptibility   Staphylococcus aureus - MIC*    CIPROFLOXACIN <=0.5 SENSITIVE Sensitive     ERYTHROMYCIN <=0.25 SENSITIVE Sensitive     GENTAMICIN <=0.5 SENSITIVE Sensitive     OXACILLIN 0.5 SENSITIVE Sensitive     TETRACYCLINE <=1 SENSITIVE Sensitive     VANCOMYCIN 1 SENSITIVE Sensitive     TRIMETH/SULFA <=10 SENSITIVE Sensitive     CLINDAMYCIN <=0.25 SENSITIVE Sensitive     RIFAMPIN <=0.5 SENSITIVE Sensitive     Inducible Clindamycin NEGATIVE Sensitive     * STAPHYLOCOCCUS AUREUS  Blood culture (routine x 2)     Status: None (Preliminary result)   Collection Time: 01/22/15  9:17 PM  Result Value Ref Range Status   Specimen Description BLOOD RIGHT ARM  Final   Special Requests BOTTLES DRAWN AEROBIC AND ANAEROBIC 5CC  Final   Culture NO GROWTH 3 DAYS  Final   Report Status PENDING  Incomplete  Culture, blood (routine x 2)     Status: None (Preliminary result)   Collection Time: 01/24/15  6:31 PM  Result Value Ref Range Status   Specimen Description BLOOD RIGHT ARM  Final   Special Requests BOTTLES DRAWN AEROBIC AND ANAEROBIC 10CC  Final   Culture NO GROWTH < 24 HOURS  Final   Report Status  PENDING  Incomplete    Studies/Results: US Renal  01/24/2015  CLINICAL DATA:  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 EXAM: RENAL / URINARY TRACT ULTRASOUND COMPLETE COMPARISON:  None. FINDINGS: First right pelvic transplant kidney: Length: 10.4 cm. Echogenicity within normal limits. No mass or hydronephrosis visualized. Normal internal blood flow with color Doppler. No peritransplant fluid. Second right pelvic transplant kidney: Length: 11.7 cm. Echogenicity within normal limits. No mass or hydronephrosis visualized. Normal internal blood flow with color Doppler. No peritransplant fluid. Bladder: Rounded, heterogeneous, predominantly  echogenic mass adherent to the bladder wall on the right, measuring 2.0 x 1.8 x 1.5 cm. No internal blood flow was seen within this mass with color Doppler. The native kidneys are surgically absent. IMPRESSION: 1. Two right pelvic transplant kidneys without hydronephrosis or peritransplant fluid. 2. 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 This could be related to the patient's previous surgery or represent an adherent, developing bladder calculus. Correlation with any previous available examinations would be helpful. Electronically Signed   By: Beckie Salts M.D.   On: 01/24/2015 20:56    Assessment/Plan: 24 yo M with complex urologic history as above currently admitted with sepsis of urinary origin with Staph Aureus. RUS performed yesterday demonstrated 2 cm echogenic mass in bladder with calcifications and no internal blood flow. 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 No bladder masses, foreign bodies, stones, fungal balls or other abnormalities. Bladder mucosa was healthy appearing without evidence of active cystitis.   OK to discharge at any point from urologic standpoint  Follow up as previously planned with Dr. Sherron Monday at [facility]    I have seen the patient with Dr. Karleen Hampshire and agree with the above assessment and plan   LOS: 3 days   Darron Doom 01/26/2015, 10:54 AM

## 2015-01-27 LAB — CULTURE, BLOOD (ROUTINE X 2): CULTURE: NO GROWTH

## 2015-01-27 MED ORDER — SODIUM CHLORIDE 0.9 % IJ SOLN: 10.0000 mL | INTRAMUSCULAR | Status: DC | PRN

## 2015-01-27 MED ORDER — CEFAZOLIN SODIUM-DEXTROSE 2-3 GM-% IV SOLR
2.0000 g | Freq: Three times a day (TID) | INTRAVENOUS | Status: DC
  Administered 2015-01-27: 2 g via INTRAVENOUS
  Filled 2015-01-27 (×3): qty 50

## 2015-01-27 MED ORDER — NITROFURANTOIN MACROCRYSTAL 50 MG PO CAPS: 50.0000 mg | ORAL_CAPSULE | Freq: Every day | ORAL | Status: AC

## 2015-01-27 MED ORDER — CEFAZOLIN SODIUM-DEXTROSE 2-3 GM-% IV SOLR: 2.0000 g | Freq: Three times a day (TID) | INTRAVENOUS | Status: AC

## 2015-01-27 NOTE — ED Notes (Signed)
Discussed final urine C&S report w caregiver for patient who has discharge orders for his patient

## 2015-01-27 NOTE — Progress Notes (Signed)
[facility]   [facility]  [facility] is providing the following services: [facility] and [facility] for [facility]. [facility] in hospital IV AB[facility] teaching to support independence at home.  [facility] will support transition to home when MD states pt ready.   If patient discharges after hours, please call [phone].   Larry Mcgee 01/27/2015, 10:57 AM

## 2015-01-27 NOTE — Progress Notes (Signed)
Peripherally Inserted Central Catheter/Midline Placement  The IV Nurse has discussed with the patient and/or persons authorized to consent for the patient, the purpose of this procedure and the potential benefits and risks involved with this procedure.  The benefits include less needle sticks, lab draws from the catheter and patient may be discharged home with the catheter.  Risks include, but not limited to, infection, bleeding, blood clot (thrombus formation), and puncture of an artery; nerve damage and irregular heat beat.  Alternatives to this procedure were also discussed.  PICC/Midline Placement Documentation     MIDLINE CATHETER PLACEMENT   Franne Gripewman, Ellsie Violette Renee 01/27/2015, 11:07 AM

## 2015-01-27 NOTE — Discharge Summary (Signed)
PATIENT DETAILS Name: Larry Mcgee Age: 24 y.o. Sex: male Date of Birth: January 07, 1991 MRN: [id number]. Admitting Physician: Eduard Clos, MD PCP:No primary care provider on file.  Admit Date: 01/22/2015 Discharge date: 01/27/2015  Recommendations for Outpatient Follow-up:  1. Ancef till 12/14 2. CBC/CMET weekly while on IV Ancef-Fax weekly labs to [phone] 3. Remove PICC line after completes course of IV Invanz  PRIMARY DISCHARGE DIAGNOSIS:  Principal Problem:   Sepsis (HCC) Active Problems:   UTI (lower urinary tract infection)   Acute pyelonephritis   Essential hypertension   History of renal transplant   Staphylococcus aureus bacteremia      PAST MEDICAL HISTORY: Past Medical History  Diagnosis Date  . Congenital renal anomaly   . Hypertension   . Headache   . Anemia     DISCHARGE MEDICATIONS: Current Discharge Medication List    START taking these medications   Details  ceFAZolin (ANCEF) 2-3 GM-% SOLR Inject 50 mLs (2 g total) into the vein every 8 (eight) hours. Stop date 02/05/15 Qty: 1600 mL, Refills: 0      CONTINUE these medications which have CHANGED   Details  nitrofurantoin (MACRODANTIN) 50 MG capsule Take 1 capsule (50 mg total) by mouth at bedtime. Qty: 30 capsule, Refills: 0      CONTINUE these medications which have NOT CHANGED   Details  acyclovir (ZOVIRAX) 200 MG capsule Take 200 mg by mouth 2 (two) times daily.    aspirin 81 MG tablet Take 81 mg by mouth daily.    lisinopril (PRINIVIL,ZESTRIL) 5 MG tablet Take 5 mg by mouth daily.    Multiple Vitamin (MULTIVITAMIN WITH MINERALS) TABS tablet Take 1 tablet by mouth daily.    mycophenolate (CELLCEPT) 500 MG tablet Take 750 mg by mouth 2 (two) times daily.    predniSONE (DELTASONE) 5 MG tablet Take 5 mg by mouth daily with breakfast.    tacrolimus (PROGRAF) 1 MG capsule Take 4 mg by mouth 2 (two) times daily.        ALLERGIES:   Allergies  Allergen Reactions  .  Ampicillin Itching and Rash  . Codeine Itching and Rash  . Latex Itching and Rash    BRIEF HPI:  See H&P, Labs, Consult and Test reports for all details in brief, patient was admitted for evaluation of fever/flank pain.   CONSULTATIONS:   ID  PERTINENT RADIOLOGIC STUDIES: US Renal  01/24/2015  CLINICAL DATA:  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 EXAM: RENAL / URINARY TRACT ULTRASOUND COMPLETE COMPARISON:  None. FINDINGS: First right pelvic transplant kidney: Length: 10.4 cm. Echogenicity within normal limits. No mass or hydronephrosis visualized. Normal internal blood flow with color Doppler. No peritransplant fluid. Second right pelvic transplant kidney: Length: 11.7 cm. Echogenicity within normal limits. No mass or hydronephrosis visualized. Normal internal blood flow with color Doppler. No peritransplant fluid. Bladder: Rounded, heterogeneous, predominantly echogenic mass adherent to the bladder wall on the right, measuring 2.0 x 1.8 x 1.5 cm. No internal blood flow was seen within this mass with color Doppler. The native kidneys are surgically absent. IMPRESSION: 1. Two right pelvic transplant kidneys without hydronephrosis or peritransplant fluid. 2. 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 This could be related to the patient's previous surgery or represent an adherent, developing bladder calculus. Correlation with any previous available examinations would be helpful. Electronically Signed   By: Zada Finders.D.  On: 01/24/2015 20:56   Dg Chest Port 1 View  01/22/2015  CLINICAL DATA:  Right lower quadrant pain, cough, fever. Renal transplant. EXAM: PORTABLE CHEST 1 VIEW COMPARISON:  None. FINDINGS: A single AP portable view of the chest demonstrates no focal airspace consolidation or alveolar edema. The lungs are grossly clear. There is no large effusion or pneumothorax. Cardiac and  mediastinal contours appear unremarkable. IMPRESSION: No active disease. Electronically Signed   By: Ellery Plunk M.D.   On: 01/22/2015 21:40     PERTINENT LAB RESULTS: CBC:  Recent Labs  01/25/15 0407  WBC 11.2*  HGB 14.1  HCT 42.9  PLT 166   CMET CMP     Component Value Date/Time   NA 138 01/25/2015 0407   K 4.2 01/25/2015 0407   CL 104 01/25/2015 0407   CO2 28 01/25/2015 0407   GLUCOSE 102* 01/25/2015 0407   BUN 8 01/25/2015 0407   CREATININE 0.96 01/25/2015 0407   CALCIUM 9.1 01/25/2015 0407   PROT 5.9* 01/23/2015 0356   ALBUMIN 3.1* 01/23/2015 0356   AST 45* 01/23/2015 0356   ALT 47 01/23/2015 0356   ALKPHOS 58 01/23/2015 0356   BILITOT 1.0 01/23/2015 0356   GFRNONAA >60 01/25/2015 0407   GFRAA >60 01/25/2015 0407    GFR Estimated Creatinine Clearance: 133.8 mL/min (by C-G formula based on Cr of 0.96). No results for input(s): LIPASE, AMYLASE in the last 72 hours. No results for input(s): CKTOTAL, CKMB, CKMBINDEX, TROPONINI in the last 72 hours. Invalid input(s): POCBNP No results for input(s): DDIMER in the last 72 hours. No results for input(s): HGBA1C in the last 72 hours. No results for input(s): CHOL, HDL, LDLCALC, TRIG, CHOLHDL, LDLDIRECT in the last 72 hours. No results for input(s): TSH, T4TOTAL, T3FREE, THYROIDAB in the last 72 hours.  Invalid input(s): FREET3 No results for input(s): VITAMINB12, FOLATE, FERRITIN, TIBC, IRON, RETICCTPCT in the last 72 hours. Coags: No results for input(s): INR in the last 72 hours.  Invalid input(s): PT Microbiology: Recent Results (from the past 240 hour(s))  Urine culture     Status: None   Collection Time: 01/22/15  8:58 PM  Result Value Ref Range Status   Specimen Description URINE, CLEAN CATCH  Final   Special Requests NONE  Final   Culture >=100,000 COLONIES/mL STAPHYLOCOCCUS AUREUS  Final   Report Status 01/25/2015 FINAL  Final   Organism ID, Bacteria STAPHYLOCOCCUS AUREUS  Final       Susceptibility   Staphylococcus aureus - MIC*    CIPROFLOXACIN <=0.5 SENSITIVE Sensitive     GENTAMICIN <=0.5 SENSITIVE Sensitive     NITROFURANTOIN <=16 SENSITIVE Sensitive     OXACILLIN 0.5 SENSITIVE Sensitive     TETRACYCLINE <=1 SENSITIVE Sensitive     VANCOMYCIN <=0.5 SENSITIVE Sensitive     TRIMETH/SULFA <=10 SENSITIVE Sensitive     CLINDAMYCIN <=0.25 SENSITIVE Sensitive     RIFAMPIN <=0.5 SENSITIVE Sensitive     Inducible Clindamycin NEGATIVE Sensitive     * >=100,000 COLONIES/mL STAPHYLOCOCCUS AUREUS  Blood culture (routine x 2)     Status: None   Collection Time: 01/22/15  9:10 PM  Result Value Ref Range Status   Specimen Description BLOOD LEFT FOREARM  Final   Special Requests BOTTLES DRAWN AEROBIC AND ANAEROBIC 10CC  Final   Culture  Setup Time   Final    GRAM POSITIVE COCCI IN PAIRS IN BOTH AEROBIC AND ANAEROBIC BOTTLES CRITICAL RESULT CALLED TO, READ BACK BY AND VERIFIED WITH: A  [facility] AT 1533 01/23/15 BY L BENFIELD    Culture STAPHYLOCOCCUS AUREUS  Final   Report Status 01/25/2015 FINAL  Final   Organism ID, Bacteria STAPHYLOCOCCUS AUREUS  Final      Susceptibility   Staphylococcus aureus - MIC*    CIPROFLOXACIN <=0.5 SENSITIVE Sensitive     ERYTHROMYCIN <=0.25 SENSITIVE Sensitive     GENTAMICIN <=0.5 SENSITIVE Sensitive     OXACILLIN 0.5 SENSITIVE Sensitive     TETRACYCLINE <=1 SENSITIVE Sensitive     VANCOMYCIN 1 SENSITIVE Sensitive     TRIMETH/SULFA <=10 SENSITIVE Sensitive     CLINDAMYCIN <=0.25 SENSITIVE Sensitive     RIFAMPIN <=0.5 SENSITIVE Sensitive     Inducible Clindamycin NEGATIVE Sensitive     * STAPHYLOCOCCUS AUREUS  Blood culture (routine x 2)     Status: None (Preliminary result)   Collection Time: 01/22/15  9:17 PM  Result Value Ref Range Status   Specimen Description BLOOD RIGHT ARM  Final   Special Requests BOTTLES DRAWN AEROBIC AND ANAEROBIC 5CC  Final   Culture NO GROWTH 4 DAYS  Final   Report Status PENDING  Incomplete  Culture,  blood (routine x 2)     Status: None (Preliminary result)   Collection Time: 01/24/15  6:31 PM  Result Value Ref Range Status   Specimen Description BLOOD RIGHT ARM  Final   Special Requests BOTTLES DRAWN AEROBIC AND ANAEROBIC 10CC  Final   Culture NO GROWTH 2 DAYS  Final   Report Status PENDING  Incomplete     BRIEF HOSPITAL COURSE:  Sepsis secondary to MSSA bacteremia: Initially thought to be secondary to pyelonephritis/gram-negative organisms-but blood and urine cultures positive for MSSA. Antibiotics now narrowed to Ancef. 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 Overall much improved-leukocytosis resolved, no fever for the past few days. ID recommends IV Ancef till 02/05/15. Needs weekly CBC/CMET [facility]ed to [facility] [phone].  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 Sherron Monday). Renal ultrasound showed a 2 cm bladder mass-Urology performed a cystoscopy on 12/4-mass was the distal end of his appendicovesicostomy. No further work up required, Urology recommends to keep appointment with Dr. Sherron Monday at [facility]  TODAY-DAY OF DISCHARGE:  Subjective:   Larry Mcgee today has no headache,no chest abdominal pain,no new weakness tingling or numbness, feels much better wants to go home today.   Objective:   Blood pressure 107/58, pulse 65, temperature 98 F (36.7 C), temperature  source Oral, resp. rate 18, height  (1.753 m), weight 89.858 kg (198 lb 1.6 oz), SpO2 96 %.  Intake/Output Summary (Last 24 hours) at 01/27/15 0955 Last data filed at 01/26/15 1905  Gross per 24 hour  Intake    920 ml  Output    300 ml  Net    620 ml   Filed Weights   01/25/15 2110 01/26/15 0454 01/27/15 0349  Weight: 89.858 kg (198 lb 1.6 oz) 89.858 kg (198 lb 1.6 oz) 89.858 kg (198 lb 1.6 oz)    Exam Awake Alert, Oriented *3, No new  F.N deficits, Normal affect [place].AT,PERRAL Supple Neck,No JVD, No cervical lymphadenopathy appriciated.  Symmetrical Chest wall movement, Good air movement bilaterally, CTAB RRR,No Gallops,Rubs or new Murmurs, No Parasternal Heave +ve B.Sounds, Abd Soft, Non tender, No organomegaly appriciated, No rebound -guarding or rigidity. No Cyanosis, Clubbing or edema, No new Rash or bruise  DISCHARGE CONDITION: Stable  DISPOSITION: Home with home health services  DISCHARGE INSTRUCTIONS:    Activity:  As tolerated   Get Medicines reviewed and adjusted: Please take all your medications with you for your next visit with your Primary MD  Please request your Primary MD to go over all hospital tests and procedure/radiological results at the follow up, please ask your Primary MD to get all Hospital records sent to his/her office.  If you experience worsening of your admission symptoms, develop shortness of breath, life threatening emergency, suicidal or homicidal thoughts you must seek medical attention immediately by calling 911 or calling your MD immediately  if symptoms less severe.  You must read complete instructions/literature along with all the possible adverse reactions/side effects for all the Medicines you take and that have been prescribed to you. Take any new Medicines after you have completely understood and accpet all the possible adverse reactions/side effects.   Do not drive when taking Pain medications.   Do not take more than prescribed  Pain, Sleep and Anxiety Medications  Special Instructions: If you have smoked or chewed Tobacco  in the last 2 yrs please stop smoking, stop any regular Alcohol  and or any Recreational drug use.  Wear Seat belts while driving.  Please note  You were cared for by a hospitalist during your hospital stay. Once you are discharged, your primary care physician will handle any further medical issues. Please note that NO REFILLS for any discharge medications will be authorized once you are discharged, as it is imperative that you return to your primary care physician (or establish a relationship with a primary care physician if you do not have one) for your aftercare needs so that they can reassess your need for medications and monitor your lab values.  Diet recommendation: Heart Healthy diet  Discharge Instructions    Call MD for:  redness, tenderness, or signs of infection (pain, swelling, redness, odor or green/yellow discharge around incision site)    Complete by:  As directed      Call MD for:  temperature >100.4    Complete by:  As directed      Diet - low sodium heart healthy    Complete by:  As directed      Increase activity slowly    Complete by:  As directed            Follow-up Information    Follow up with Johny Sax, MD. Schedule an appointment as soon as possible for a visit in 1 week.   Specialty:  Infectious Diseases   Contact information:   [address] [place] [address] [phone]       Follow up with MACDIARMID,SCOTT A, MD.   Specialty:  Urology   Why:  keep existing appointment   Contact information:   [address] [place] [address] [phone]       Follow up with [facility]. Schedule an appointment as soon as possible for a visit in 1 month.   Why:  with a nephrologist of your choice   Contact information:   [address] [place] [address] [phone]  Total Time spent on discharge equals 45  minutes.  SignedJeoffrey Massed: Dontarius Sheley 01/27/2015 9:55 AM

## 2015-01-27 NOTE — Progress Notes (Signed)
Larry Mcgee to be D/C'd Home per MD order.  Discussed prescriptions and follow up appointments with the patient. Prescriptions given to patient, medication list explained in detail. Pt verbalized understanding.    Medication List    TAKE these medications        acyclovir 200 MG capsule  Commonly known as:  ZOVIRAX  Take 200 mg by mouth 2 (two) times daily.     aspirin 81 MG tablet  Take 81 mg by mouth daily.     ceFAZolin 2-3 GM-% Solr  Commonly known as:  ANCEF  Inject 50 mLs (2 g total) into the vein every 8 (eight) hours. Stop date 02/05/15     CELLCEPT 500 MG tablet  Generic drug:  mycophenolate  Take 750 mg by mouth 2 (two) times daily.     lisinopril 5 MG tablet  Commonly known as:  PRINIVIL,ZESTRIL  Take 5 mg by mouth daily.     multivitamin with minerals Tabs tablet  Take 1 tablet by mouth daily.     nitrofurantoin 50 MG capsule  Commonly known as:  MACRODANTIN  Take 1 capsule (50 mg total) by mouth at bedtime.     predniSONE 5 MG tablet  Commonly known as:  DELTASONE  Take 5 mg by mouth daily with breakfast.     tacrolimus 1 MG capsule  Commonly known as:  PROGRAF  Take 4 mg by mouth 2 (two) times daily.        Filed Vitals:   01/26/15 1713 01/27/15 0944  BP: 116/65 107/58  Pulse: 68 65  Temp: 98.3 F (36.8 C) 98 F (36.7 C)  Resp: 18 18    Skin clean, dry and intact without evidence of skin break down, no evidence of skin tears noted. IV catheter discontinued intact. Site without signs and symptoms of complications. Dressing and pressure applied. Pt denies pain at this time. No complaints noted.  An After Visit Summary was printed and given to the patient. Patient ambulated off the unit, and D/C home via private auto.  Janeann ForehandLuke Jaxiel Kines BSN, RN

## 2015-01-27 NOTE — Care Management Note (Signed)
Case Management Note  Patient Details  Name: Larry Mcgee MRN: [id number] Date of Birth: 25-Dec-1990  Subjective/Objective:          CM following for progression and d/c planning.          Action/Plan: 01/27/2015 Pt for d/c today after Midline placed and IV antibiotics arranged for home administration with HH to assist. Pt selected [facility] given insurance. [facility] notified and rep Hortencia PilarP Chandler RN , will meet with pt to discuss plan of care and procedure.   Expected Discharge Date:    01/27/2015              Expected Discharge Plan:  Home w Home Health Services  In-House Referral:  NA  Discharge planning Services  CM Consult  Post Acute Care Choice:  Durable Medical Equipment, Home Health Choice offered to:  Patient  DME Arranged:  IV pump/equipment DME Agency:  [facility]  [facility]  Status of Service:  Completed, signed off  Medicare Important Message Given:    Date Medicare IM Given:    Medicare IM give by:    Date Additional Medicare IM Given:    Additional Medicare Important Message give by:     If discussed at Long Length of Stay Meetings, dates discussed:    Additional Comments:  Starlyn SkeansRoyal, Roddy Bellamy U, RN 01/27/2015, 10:18 AM

## 2015-01-29 LAB — CULTURE, BLOOD (ROUTINE X 2): CULTURE: NO GROWTH

## 2015-02-05 ENCOUNTER — Ambulatory Visit (INDEPENDENT_AMBULATORY_CARE_PROVIDER_SITE_OTHER): Payer: 59 | Admitting: Infectious Diseases

## 2015-02-05 ENCOUNTER — Encounter: Payer: Self-pay | Admitting: Infectious Diseases

## 2015-02-05 VITALS — BP 114/79 | HR 134 | Temp 97.6°F | Ht 69.0 in | Wt 190.4 lb

## 2015-02-05 DIAGNOSIS — A4101 Sepsis due to Methicillin susceptible Staphylococcus aureus: Secondary | ICD-10-CM

## 2015-02-05 DIAGNOSIS — B9561 Methicillin susceptible Staphylococcus aureus infection as the cause of diseases classified elsewhere: Secondary | ICD-10-CM

## 2015-02-05 DIAGNOSIS — R7881 Bacteremia: Secondary | ICD-10-CM | POA: Diagnosis not present

## 2015-02-05 MED ORDER — CEPHALEXIN 500 MG PO CAPS: 500.0000 mg | ORAL_CAPSULE | Freq: Four times a day (QID) | ORAL | Status: AC

## 2015-02-05 NOTE — Assessment & Plan Note (Signed)
He is doing well.  Will recheck his BCx Pull his PIC line today.  Give him 2 weeks of keflex po [place]

## 2015-02-05 NOTE — Progress Notes (Signed)
Per Dr Ninetta LightsHatcher 21 cm  Single lumen Peripherally Inserted Central Catheter  removed from right basilic . No sutures present. Dressing was clean and dry . Area cleansed with chlorhexidine and petroleum dressing applied. Pt advised no heavy lifting with this arm, leave dressing for 24 hours and call the office if dressing becomes soaked with blood or sharp pain presents.  Pt tolerated procedure well.    Laurell Josephsammy K Ermelinda Eckert, RN

## 2015-02-05 NOTE — Assessment & Plan Note (Signed)
Resolved, doing well, will recheck his BCx
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
## 2015-02-25 ENCOUNTER — Encounter: Payer: Self-pay | Admitting: Infectious Diseases

## 2015-04-28 ENCOUNTER — Emergency Department (HOSPITAL_COMMUNITY): Payer: 59

## 2015-04-28 ENCOUNTER — Emergency Department (HOSPITAL_COMMUNITY)
Admission: EM | Admit: 2015-04-28 | Discharge: 2015-04-28 | Disposition: A | Discharge status: Home / Self Care | Payer: 59 | Attending: Physician Assistant | Admitting: Physician Assistant

## 2015-04-28 ENCOUNTER — Encounter (HOSPITAL_COMMUNITY): Payer: Self-pay | Admitting: Emergency Medicine

## 2015-04-28 DIAGNOSIS — Z91040 Latex allergy status: Secondary | ICD-10-CM | POA: Diagnosis not present

## 2015-04-28 DIAGNOSIS — Z862 Personal history of diseases of the blood and blood-forming organs and certain disorders involving the immune mechanism: Secondary | ICD-10-CM | POA: Diagnosis not present

## 2015-04-28 DIAGNOSIS — Y92410 Unspecified street and highway as the place of occurrence of the external cause: Secondary | ICD-10-CM | POA: Diagnosis not present

## 2015-04-28 DIAGNOSIS — Z7952 Long term (current) use of systemic steroids: Secondary | ICD-10-CM | POA: Diagnosis not present

## 2015-04-28 DIAGNOSIS — Z7982 Long term (current) use of aspirin: Secondary | ICD-10-CM | POA: Insufficient documentation

## 2015-04-28 DIAGNOSIS — S4991XA Unspecified injury of right shoulder and upper arm, initial encounter: Secondary | ICD-10-CM | POA: Diagnosis present

## 2015-04-28 DIAGNOSIS — S59901A Unspecified injury of right elbow, initial encounter: Secondary | ICD-10-CM | POA: Diagnosis not present

## 2015-04-28 DIAGNOSIS — Y9355 Activity, bike riding: Secondary | ICD-10-CM | POA: Diagnosis not present

## 2015-04-28 DIAGNOSIS — Z87718 Personal history of other specified (corrected) congenital malformations of genitourinary system: Secondary | ICD-10-CM | POA: Insufficient documentation

## 2015-04-28 DIAGNOSIS — S52121A Displaced fracture of head of right radius, initial encounter for closed fracture: Secondary | ICD-10-CM | POA: Insufficient documentation

## 2015-04-28 DIAGNOSIS — Z792 Long term (current) use of antibiotics: Secondary | ICD-10-CM | POA: Insufficient documentation

## 2015-04-28 DIAGNOSIS — S80212A Abrasion, left knee, initial encounter: Secondary | ICD-10-CM | POA: Insufficient documentation

## 2015-04-28 DIAGNOSIS — Z79899 Other long term (current) drug therapy: Secondary | ICD-10-CM | POA: Diagnosis not present

## 2015-04-28 DIAGNOSIS — I10 Essential (primary) hypertension: Secondary | ICD-10-CM | POA: Diagnosis not present

## 2015-04-28 DIAGNOSIS — Z880 Allergy status to penicillin: Secondary | ICD-10-CM | POA: Diagnosis not present

## 2015-04-28 DIAGNOSIS — Y998 Other external cause status: Secondary | ICD-10-CM | POA: Insufficient documentation
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
## 2015-04-28 NOTE — Progress Notes (Signed)
Orthopedic Tech Progress Note Patient Details:  Larry Mcgee 1990-10-12 [id number] Applied fiberglass posterior long arm splint to RUE.  Pulses, sensation, motion intact before and after splinting.  Capillary refill less than 2 seconds before and after splinting.  Placed splinted RUE in arm sling. Ortho Devices Type of Ortho Device: Post (long arm) splint, Arm sling Ortho Device/Splint Location: RUE Ortho Device/Splint Interventions: Application   Lesle ChrisGilliland, Jeter Tomey L 04/28/2015, 9:07 PM
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
## 2015-04-28 NOTE — ED Notes (Signed)
Ortho tech notified.  

## 2016-03-11 IMAGING — US US RENAL
1 series · 13 of 25 positions shown · non-contrast
Comparison: None.

CLINICAL DATA: Right pelvic transplant kidney with a clinical
concern for infection.

EXAM:
RENAL / URINARY TRACT ULTRASOUND COMPLETE

[Series 1: us renal · 0.30mm/px · 13 of 35 slices shown]
[im 1/35]
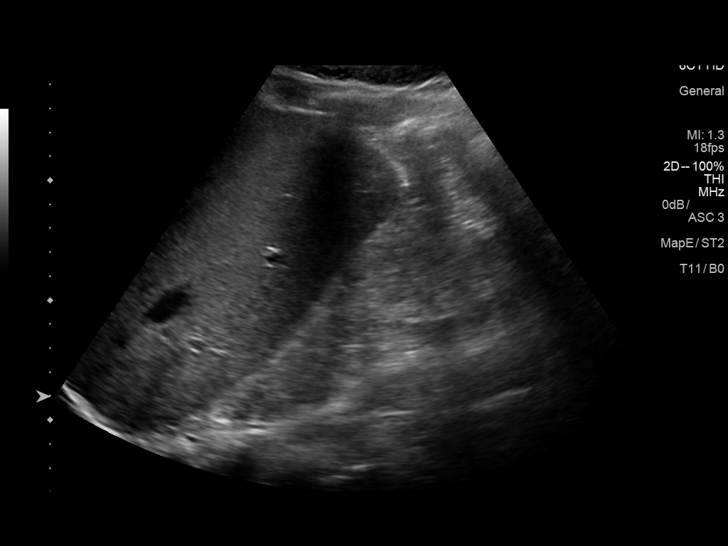
[im 3/35]
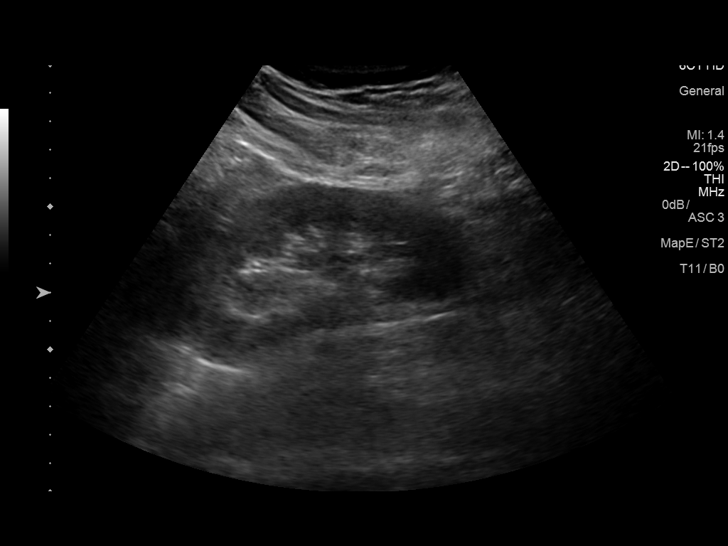
[im 6/35]
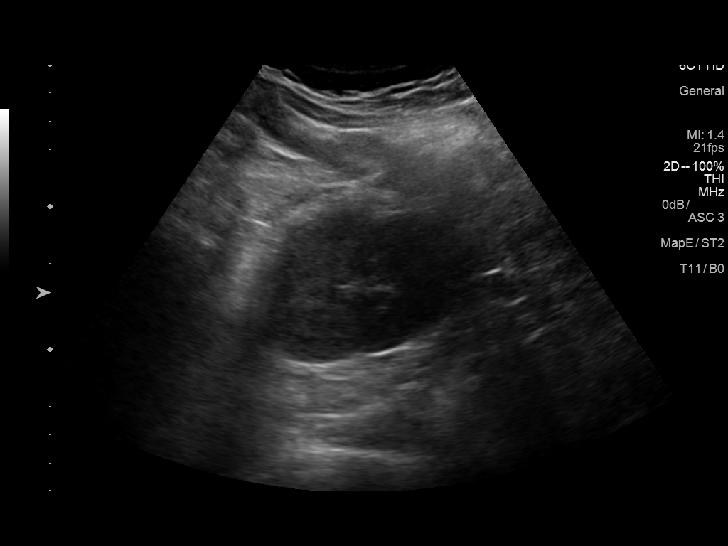
[im 9/35]
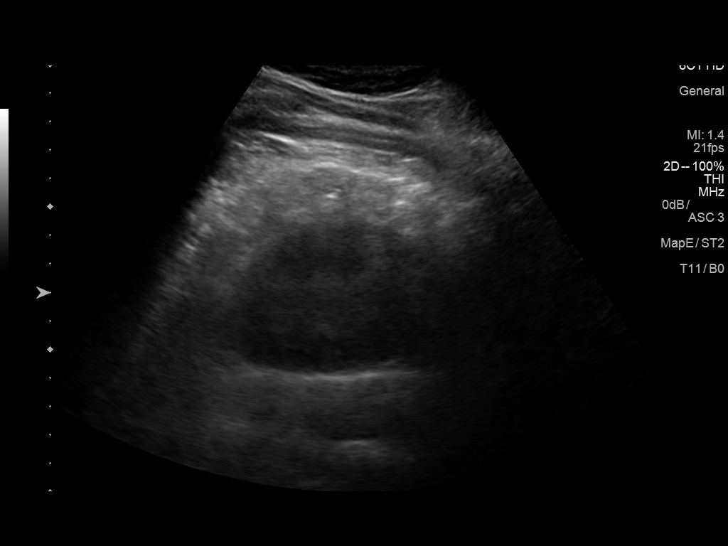
[im 12/35]
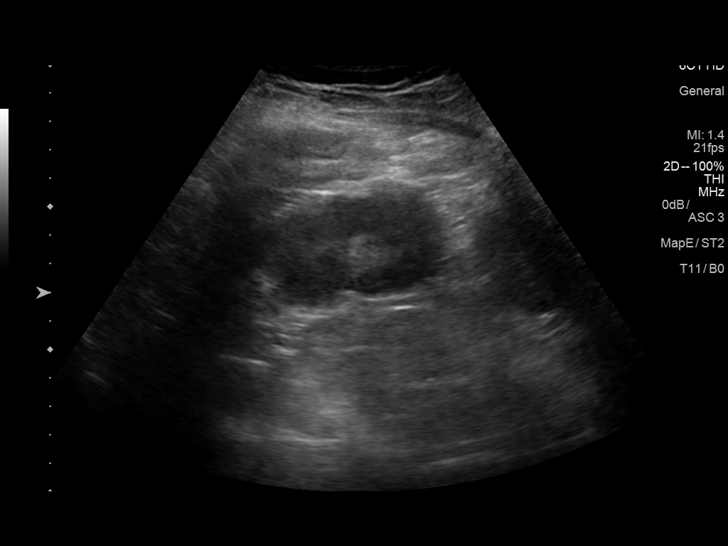
[im 15/35]
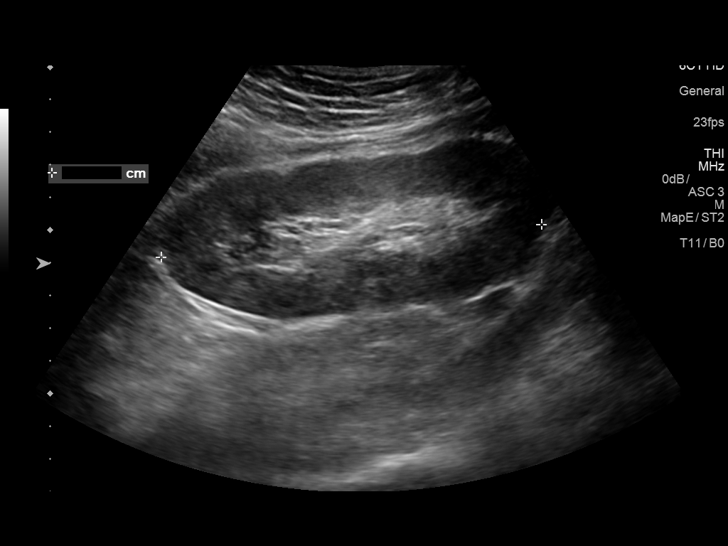
[im 18/35]
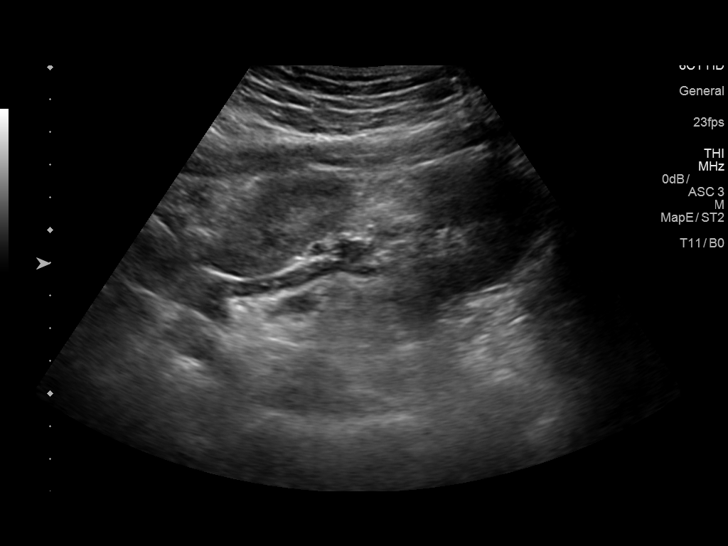
[im 20/35]
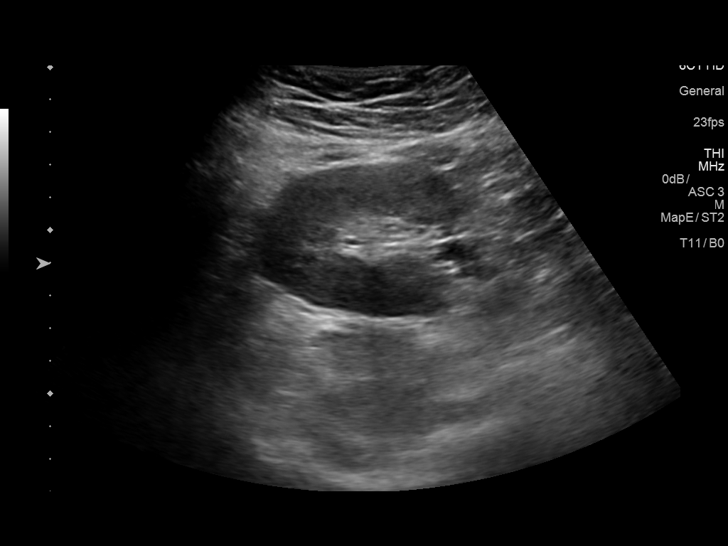
[im 23/35]
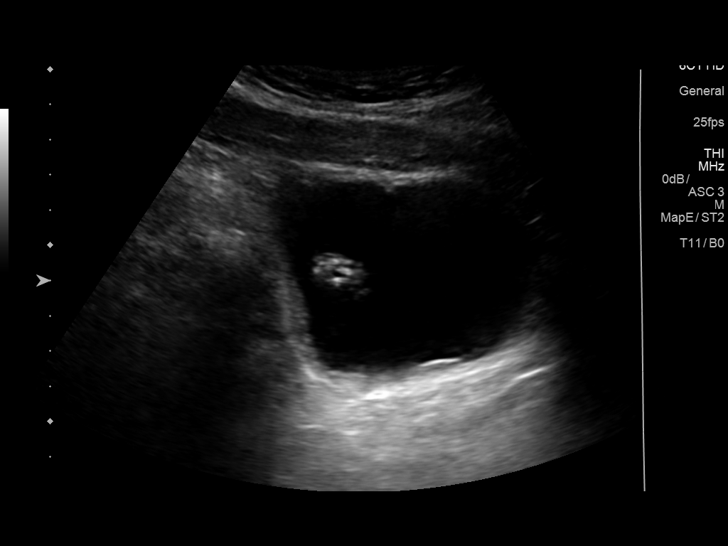
[im 26/35]
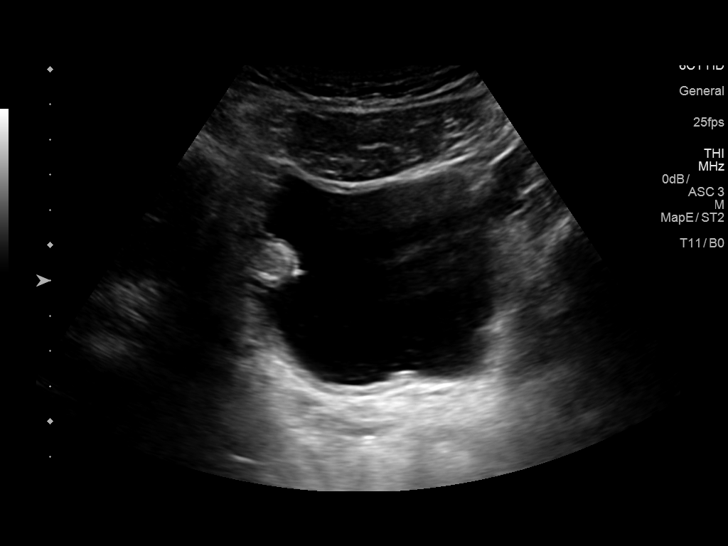
[im 29/35]
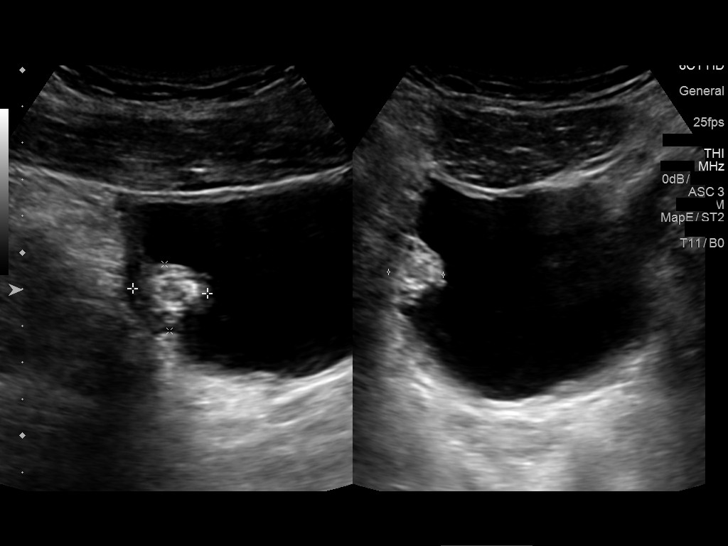
[im 32/35]
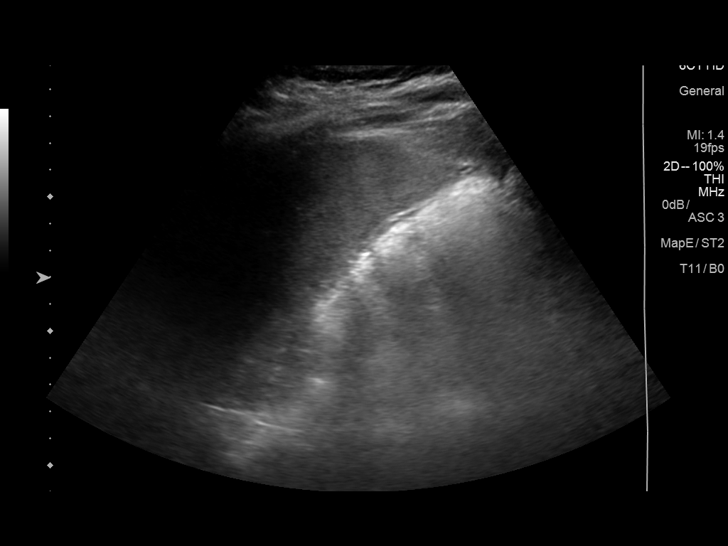
[im 35/35]
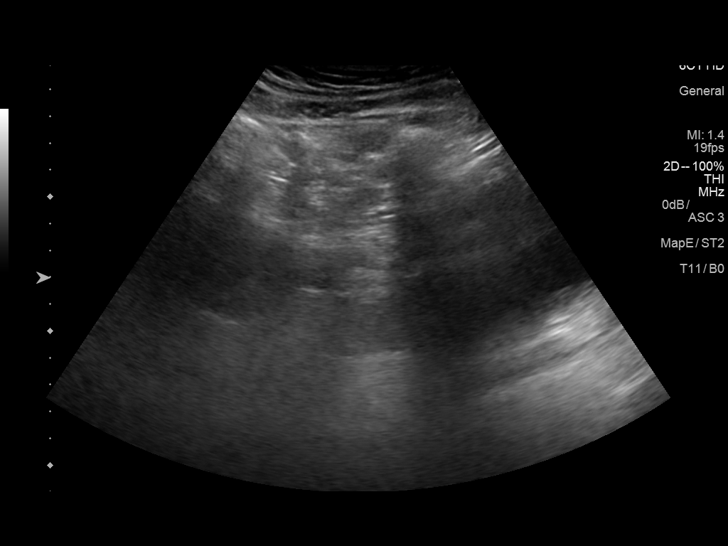

[13 of 25 positions shown; findings below may reference images not displayed]

FINDINGS: First right pelvic transplant kidney:

Length: 10.4 cm. Echogenicity within normal limits. No mass or
hydronephrosis visualized. Normal internal blood flow with color
Doppler. No peritransplant fluid.

Second right pelvic transplant kidney:

Length: 11.7 cm. Echogenicity within normal limits. No mass or
hydronephrosis visualized. Normal internal blood flow with color
Doppler. No peritransplant fluid.

Bladder:

Rounded, heterogeneous, predominantly echogenic mass adherent to the
bladder wall on the right, measuring 2.0 x 1.8 x 1.5 cm. No internal
blood flow was seen within this mass with color Doppler.

The native kidneys are surgically absent.
IMPRESSION: 1. Two right pelvic transplant kidneys without hydronephrosis or
peritransplant fluid.
2. 2.0 cm rounded, predominantly echogenic mass in the urinary
bladder on the right, without visible internal blood flow. This
could be related to the patient's previous surgery or represent an
adherent, developing bladder calculus. Correlation with any previous
available examinations would be helpful.
